# Patient Record
Sex: Female | Born: 1960 | Race: White | Hispanic: No | Marital: Married | Smoking: Never smoker
Health system: Southern US, Community
[De-identification: ages and names within clinical notes are randomized; demographics above are authoritative.]

## PROBLEM LIST (undated history)

## (undated) DIAGNOSIS — R748 Abnormal levels of other serum enzymes: Secondary | ICD-10-CM

## (undated) DIAGNOSIS — K631 Perforation of intestine (nontraumatic): Secondary | ICD-10-CM

## (undated) DIAGNOSIS — Z9049 Acquired absence of other specified parts of digestive tract: Secondary | ICD-10-CM

## (undated) DIAGNOSIS — Z90710 Acquired absence of both cervix and uterus: Secondary | ICD-10-CM

## (undated) DIAGNOSIS — K2970 Gastritis, unspecified, without bleeding: Secondary | ICD-10-CM

## (undated) DIAGNOSIS — E785 Hyperlipidemia, unspecified: Secondary | ICD-10-CM

## (undated) DIAGNOSIS — J45909 Unspecified asthma, uncomplicated: Secondary | ICD-10-CM

## (undated) DIAGNOSIS — K222 Esophageal obstruction: Secondary | ICD-10-CM

## (undated) DIAGNOSIS — D649 Anemia, unspecified: Secondary | ICD-10-CM

## (undated) DIAGNOSIS — R948 Abnormal results of function studies of other organs and systems: Secondary | ICD-10-CM

## (undated) DIAGNOSIS — N3010 Interstitial cystitis (chronic) without hematuria: Secondary | ICD-10-CM

## (undated) DIAGNOSIS — K219 Gastro-esophageal reflux disease without esophagitis: Secondary | ICD-10-CM

## (undated) DIAGNOSIS — E559 Vitamin D deficiency, unspecified: Secondary | ICD-10-CM

## (undated) DIAGNOSIS — Z1509 Genetic susceptibility to other malignant neoplasm: Secondary | ICD-10-CM

## (undated) DIAGNOSIS — L570 Actinic keratosis: Secondary | ICD-10-CM

## (undated) DIAGNOSIS — R1012 Left upper quadrant pain: Secondary | ICD-10-CM

## (undated) DIAGNOSIS — E663 Overweight: Secondary | ICD-10-CM

## (undated) HISTORY — DX: Esophageal obstruction: K22.2

## (undated) HISTORY — DX: Interstitial cystitis (chronic) without hematuria: N30.10

## (undated) HISTORY — DX: Abnormal levels of other serum enzymes: R74.8

## (undated) HISTORY — PX: FOOT SURGERY: SHX648

## (undated) HISTORY — PX: ABDOMINAL HYSTERECTOMY: SHX81

## (undated) HISTORY — PX: TOTAL COLECTOMY: SHX852

## (undated) HISTORY — DX: Actinic keratosis: L57.0

## (undated) HISTORY — DX: Gastritis, unspecified, without bleeding: K29.70

## (undated) HISTORY — DX: Overweight: E66.3

## (undated) HISTORY — DX: Perforation of intestine (nontraumatic): K63.1

## (undated) HISTORY — PX: CHOLECYSTECTOMY: SHX55

## (undated) HISTORY — DX: Abnormal results of function studies of other organs and systems: R94.8

## (undated) HISTORY — DX: Acquired absence of both cervix and uterus: Z90.710

## (undated) HISTORY — DX: Genetic susceptibility to other malignant neoplasm: Z15.09

## (undated) HISTORY — PX: APPENDECTOMY: SHX54

## (undated) HISTORY — DX: Vitamin D deficiency, unspecified: E55.9

## (undated) HISTORY — DX: Left upper quadrant pain: R10.12

## (undated) HISTORY — DX: Hyperlipidemia, unspecified: E78.5

## (undated) HISTORY — PX: CERVICAL FUSION: SHX112

## (undated) HISTORY — DX: Acquired absence of other specified parts of digestive tract: Z90.49

---

## 2018-08-30 DIAGNOSIS — Z0000 Encounter for general adult medical examination without abnormal findings: Secondary | ICD-10-CM | POA: Diagnosis not present

## 2018-09-02 ENCOUNTER — Other Ambulatory Visit: Payer: Self-pay | Admitting: Family Medicine

## 2018-09-02 DIAGNOSIS — Z0000 Encounter for general adult medical examination without abnormal findings: Secondary | ICD-10-CM | POA: Diagnosis not present

## 2018-09-02 DIAGNOSIS — Z13220 Encounter for screening for lipoid disorders: Secondary | ICD-10-CM | POA: Diagnosis not present

## 2018-09-02 DIAGNOSIS — M899 Disorder of bone, unspecified: Secondary | ICD-10-CM | POA: Diagnosis not present

## 2018-09-02 DIAGNOSIS — Z1159 Encounter for screening for other viral diseases: Secondary | ICD-10-CM | POA: Diagnosis not present

## 2018-09-02 DIAGNOSIS — Z1231 Encounter for screening mammogram for malignant neoplasm of breast: Secondary | ICD-10-CM

## 2018-09-27 ENCOUNTER — Ambulatory Visit
Admission: RE | Admit: 2018-09-27 | Discharge: 2018-09-27 | Disposition: A | Discharge status: Home / Self Care | Payer: BLUE CROSS/BLUE SHIELD | Source: Ambulatory Visit | Attending: Family Medicine | Admitting: Family Medicine

## 2018-09-27 DIAGNOSIS — Z1231 Encounter for screening mammogram for malignant neoplasm of breast: Secondary | ICD-10-CM | POA: Diagnosis not present

## 2019-05-12 DIAGNOSIS — Z23 Encounter for immunization: Secondary | ICD-10-CM | POA: Diagnosis not present

## 2019-12-08 ENCOUNTER — Other Ambulatory Visit (HOSPITAL_COMMUNITY): Payer: Self-pay | Admitting: Orthopedic Surgery

## 2019-12-19 ENCOUNTER — Other Ambulatory Visit: Payer: Self-pay

## 2019-12-19 ENCOUNTER — Encounter (HOSPITAL_BASED_OUTPATIENT_CLINIC_OR_DEPARTMENT_OTHER): Payer: Self-pay | Admitting: Orthopedic Surgery

## 2019-12-22 ENCOUNTER — Other Ambulatory Visit (HOSPITAL_COMMUNITY)
Admission: RE | Admit: 2019-12-22 | Discharge: 2019-12-22 | Disposition: A | Discharge status: Home / Self Care | Payer: BC Managed Care – PPO | Source: Ambulatory Visit | Attending: Orthopedic Surgery | Admitting: Orthopedic Surgery

## 2019-12-22 DIAGNOSIS — Z01812 Encounter for preprocedural laboratory examination: Secondary | ICD-10-CM | POA: Insufficient documentation

## 2019-12-22 DIAGNOSIS — Z20822 Contact with and (suspected) exposure to covid-19: Secondary | ICD-10-CM | POA: Diagnosis not present

## 2019-12-22 LAB — SARS CORONAVIRUS 2 (TAT 6-24 HRS): SARS Coronavirus 2: NEGATIVE

## 2019-12-22 NOTE — Progress Notes (Signed)

## 2019-12-25 ENCOUNTER — Encounter (HOSPITAL_BASED_OUTPATIENT_CLINIC_OR_DEPARTMENT_OTHER)
Admission: RE | Disposition: A | Discharge status: Home / Self Care | Payer: Self-pay | Source: Home / Self Care | Attending: Orthopedic Surgery

## 2019-12-25 ENCOUNTER — Ambulatory Visit (HOSPITAL_BASED_OUTPATIENT_CLINIC_OR_DEPARTMENT_OTHER): Payer: BC Managed Care – PPO | Admitting: Anesthesiology

## 2019-12-25 ENCOUNTER — Ambulatory Visit (HOSPITAL_BASED_OUTPATIENT_CLINIC_OR_DEPARTMENT_OTHER)
Admission: RE | Admit: 2019-12-25 | Discharge: 2019-12-25 | Disposition: A | Discharge status: Home / Self Care | Payer: BC Managed Care – PPO | Attending: Orthopedic Surgery | Admitting: Orthopedic Surgery

## 2019-12-25 ENCOUNTER — Other Ambulatory Visit: Payer: Self-pay

## 2019-12-25 ENCOUNTER — Encounter (HOSPITAL_BASED_OUTPATIENT_CLINIC_OR_DEPARTMENT_OTHER): Payer: Self-pay | Admitting: Orthopedic Surgery

## 2019-12-25 DIAGNOSIS — M2041 Other hammer toe(s) (acquired), right foot: Secondary | ICD-10-CM | POA: Insufficient documentation

## 2019-12-25 DIAGNOSIS — Z79899 Other long term (current) drug therapy: Secondary | ICD-10-CM | POA: Diagnosis not present

## 2019-12-25 DIAGNOSIS — M7741 Metatarsalgia, right foot: Secondary | ICD-10-CM | POA: Insufficient documentation

## 2019-12-25 DIAGNOSIS — J45909 Unspecified asthma, uncomplicated: Secondary | ICD-10-CM | POA: Insufficient documentation

## 2019-12-25 DIAGNOSIS — M2021 Hallux rigidus, right foot: Secondary | ICD-10-CM | POA: Diagnosis not present

## 2019-12-25 DIAGNOSIS — Z981 Arthrodesis status: Secondary | ICD-10-CM | POA: Diagnosis not present

## 2019-12-25 DIAGNOSIS — K219 Gastro-esophageal reflux disease without esophagitis: Secondary | ICD-10-CM | POA: Insufficient documentation

## 2019-12-25 HISTORY — PX: ARTHRODESIS METATARSALPHALANGEAL JOINT (MTPJ): SHX6566

## 2019-12-25 HISTORY — PX: WEIL OSTEOTOMY: SHX5044

## 2019-12-25 HISTORY — DX: Unspecified asthma, uncomplicated: J45.909

## 2019-12-25 HISTORY — DX: Gastro-esophageal reflux disease without esophagitis: K21.9

## 2019-12-25 HISTORY — DX: Anemia, unspecified: D64.9

## 2019-12-25 SURGERY — FUSION, JOINT, GREAT TOE
Anesthesia: Regional | Site: Toe | Laterality: Right

## 2019-12-25 MED ORDER — BUPIVACAINE HCL (PF) 0.5 % IJ SOLN
INTRAMUSCULAR | Status: AC
  Filled 2019-12-25: qty 30

## 2019-12-25 MED ORDER — FENTANYL CITRATE (PF) 100 MCG/2ML IJ SOLN: 25.0000 ug | INTRAMUSCULAR | Status: DC | PRN

## 2019-12-25 MED ORDER — PROPOFOL 500 MG/50ML IV EMUL
INTRAVENOUS | Status: DC | PRN
Start: 2019-12-25 — End: 2019-12-25
  Administered 2019-12-25: 150 ug/kg/min via INTRAVENOUS

## 2019-12-25 MED ORDER — VANCOMYCIN HCL 500 MG IV SOLR
INTRAVENOUS | Status: DC | PRN
  Administered 2019-12-25: 500 mg via TOPICAL

## 2019-12-25 MED ORDER — SENNA 8.6 MG PO TABS
2.0000 | ORAL_TABLET | Freq: Two times a day (BID) | ORAL | 0 refills | Status: DC
Start: 2019-12-25 — End: 2022-04-11

## 2019-12-25 MED ORDER — PHENYLEPHRINE HCL (PRESSORS) 10 MG/ML IV SOLN
INTRAVENOUS | Status: DC | PRN
  Administered 2019-12-25: 40 ug via INTRAVENOUS

## 2019-12-25 MED ORDER — OXYCODONE HCL 5 MG PO TABS: 5.0000 mg | ORAL_TABLET | Freq: Four times a day (QID) | ORAL | 0 refills | Status: AC | PRN

## 2019-12-25 MED ORDER — ROPIVACAINE HCL 5 MG/ML IJ SOLN
INTRAMUSCULAR | Status: DC | PRN
Start: 2019-12-25 — End: 2019-12-25
  Administered 2019-12-25: 20 mL via PERINEURAL
  Administered 2019-12-25: 15 mL via PERINEURAL

## 2019-12-25 MED ORDER — CEFAZOLIN SODIUM-DEXTROSE 2-4 GM/100ML-% IV SOLN: 2.0000 g | INTRAVENOUS | Status: DC

## 2019-12-25 MED ORDER — DOCUSATE SODIUM 100 MG PO CAPS
100.0000 mg | ORAL_CAPSULE | Freq: Every day | ORAL | 2 refills | Status: AC | PRN
Start: 2019-12-25 — End: 2020-12-24

## 2019-12-25 MED ORDER — FENTANYL CITRATE (PF) 100 MCG/2ML IJ SOLN
INTRAMUSCULAR | Status: AC
  Filled 2019-12-25: qty 2

## 2019-12-25 MED ORDER — 0.9 % SODIUM CHLORIDE (POUR BTL) OPTIME
TOPICAL | Status: DC | PRN
  Administered 2019-12-25: 1000 mL

## 2019-12-25 MED ORDER — DEXAMETHASONE SODIUM PHOSPHATE 10 MG/ML IJ SOLN
INTRAMUSCULAR | Status: DC | PRN
  Administered 2019-12-25: 4 mg via INTRAVENOUS

## 2019-12-25 MED ORDER — LIDOCAINE HCL (CARDIAC) PF 100 MG/5ML IV SOSY
PREFILLED_SYRINGE | INTRAVENOUS | Status: DC | PRN
  Administered 2019-12-25: 30 mg via INTRAVENOUS

## 2019-12-25 MED ORDER — MIDAZOLAM HCL 2 MG/2ML IJ SOLN
1.0000 mg | INTRAMUSCULAR | Status: DC | PRN
  Administered 2019-12-25: 2 mg via INTRAVENOUS

## 2019-12-25 MED ORDER — LACTATED RINGERS IV SOLN
INTRAVENOUS | Status: DC
  Administered 2019-12-25: 10 mL/h via INTRAVENOUS

## 2019-12-25 MED ORDER — PROPOFOL 10 MG/ML IV BOLUS
INTRAVENOUS | Status: DC | PRN
  Administered 2019-12-25: 50 mg via INTRAVENOUS

## 2019-12-25 MED ORDER — CEFAZOLIN SODIUM-DEXTROSE 2-4 GM/100ML-% IV SOLN
INTRAVENOUS | Status: AC
  Filled 2019-12-25: qty 100

## 2019-12-25 MED ORDER — MIDAZOLAM HCL 2 MG/2ML IJ SOLN
INTRAMUSCULAR | Status: AC
  Filled 2019-12-25: qty 2

## 2019-12-25 MED ORDER — DEXAMETHASONE SODIUM PHOSPHATE 10 MG/ML IJ SOLN
INTRAMUSCULAR | Status: DC | PRN
Start: 2019-12-25 — End: 2019-12-25
  Administered 2019-12-25 (×2): 5 mg

## 2019-12-25 MED ORDER — SODIUM CHLORIDE 0.9 % IV SOLN: INTRAVENOUS | Status: DC

## 2019-12-25 MED ORDER — VANCOMYCIN HCL 500 MG IV SOLR
INTRAVENOUS | Status: AC
  Filled 2019-12-25: qty 500

## 2019-12-25 MED ORDER — ACETAMINOPHEN 500 MG PO TABS: 1000.0000 mg | ORAL_TABLET | Freq: Once | ORAL | Status: DC

## 2019-12-25 MED ORDER — ONDANSETRON HCL 4 MG/2ML IJ SOLN
INTRAMUSCULAR | Status: DC | PRN
  Administered 2019-12-25: 4 mg via INTRAVENOUS

## 2019-12-25 MED ORDER — FENTANYL CITRATE (PF) 100 MCG/2ML IJ SOLN
50.0000 ug | INTRAMUSCULAR | Status: DC | PRN
  Administered 2019-12-25: 50 ug via INTRAVENOUS

## 2019-12-25 MED ORDER — LIDOCAINE-EPINEPHRINE 1 %-1:100000 IJ SOLN
INTRAMUSCULAR | Status: AC
  Filled 2019-12-25: qty 1

## 2019-12-25 SURGICAL SUPPLY — 93 items
BANDAGE ESMARK 6X9 LF (GAUZE/BANDAGES/DRESSINGS) IMPLANT
BIT DRILL 2.7XCANN QCK CNCT (BIT) IMPLANT
BIT DRILL CANN 2.7 (BIT) ×1
BIT DRILL Q-C 2.0 DIA 100 (BIT) ×1 IMPLANT
BIT DRL 2.7XCANN QCK CNCT (BIT) ×3
BLADE AVERAGE 25X9 (BLADE) IMPLANT
BLADE LONG MED 25X9 (BLADE) ×4 IMPLANT
BLADE MICRO SAGITTAL (BLADE) IMPLANT
BLADE OSC/SAG .038X5.5 CUT EDG (BLADE) ×1 IMPLANT
BLADE SURG 15 STRL LF DISP TIS (BLADE) ×9 IMPLANT
BLADE SURG 15 STRL SS (BLADE) ×3
BNDG COHESIVE 4X5 TAN STRL (GAUZE/BANDAGES/DRESSINGS) ×3 IMPLANT
BNDG COHESIVE 6X5 TAN STRL LF (GAUZE/BANDAGES/DRESSINGS) IMPLANT
BNDG CONFORM 2 STRL LF (GAUZE/BANDAGES/DRESSINGS) ×1 IMPLANT
BNDG CONFORM 3 STRL LF (GAUZE/BANDAGES/DRESSINGS) ×4 IMPLANT
BNDG ELASTIC 4X5.8 VLCR STR LF (GAUZE/BANDAGES/DRESSINGS) ×4 IMPLANT
BNDG ESMARK 4X9 LF (GAUZE/BANDAGES/DRESSINGS) IMPLANT
BNDG ESMARK 6X9 LF (GAUZE/BANDAGES/DRESSINGS)
BOOT STEPPER DURA LG (SOFTGOODS) IMPLANT
BOOT STEPPER DURA MED (SOFTGOODS) IMPLANT
BOOT STEPPER DURA SM (SOFTGOODS) ×1 IMPLANT
BOOT STEPPER DURA XLG (SOFTGOODS) IMPLANT
CAP PIN PROTECTOR ORTHO WHT (CAP) IMPLANT
CHLORAPREP W/TINT 26 (MISCELLANEOUS) ×4 IMPLANT
COVER BACK TABLE 60X90IN (DRAPES) ×4 IMPLANT
COVER WAND RF STERILE (DRAPES) IMPLANT
CUFF TOURN SGL QUICK 24 (TOURNIQUET CUFF)
CUFF TOURN SGL QUICK 34 (TOURNIQUET CUFF) ×1
CUFF TRNQT CYL 24X4X16.5-23 (TOURNIQUET CUFF) IMPLANT
CUFF TRNQT CYL 34X4.125X (TOURNIQUET CUFF) IMPLANT
DRAPE EXTREMITY T 121X128X90 (DISPOSABLE) ×4 IMPLANT
DRAPE OEC MINIVIEW 54X84 (DRAPES) ×4 IMPLANT
DRAPE U-SHAPE 47X51 STRL (DRAPES) ×4 IMPLANT
DRSG MEPITEL 4X7.2 (GAUZE/BANDAGES/DRESSINGS) ×4 IMPLANT
DRSG PAD ABDOMINAL 8X10 ST (GAUZE/BANDAGES/DRESSINGS) ×4 IMPLANT
ELECT REM PT RETURN 9FT ADLT (ELECTROSURGICAL) ×4
ELECTRODE REM PT RTRN 9FT ADLT (ELECTROSURGICAL) ×3 IMPLANT
GAUZE SPONGE 4X4 12PLY STRL (GAUZE/BANDAGES/DRESSINGS) ×4 IMPLANT
GLOVE BIO SURGEON STRL SZ8 (GLOVE) ×4 IMPLANT
GLOVE BIOGEL PI IND STRL 7.0 (GLOVE) IMPLANT
GLOVE BIOGEL PI IND STRL 8 (GLOVE) ×6 IMPLANT
GLOVE BIOGEL PI INDICATOR 7.0 (GLOVE) ×2
GLOVE BIOGEL PI INDICATOR 8 (GLOVE) ×2
GLOVE ECLIPSE 8.0 STRL XLNG CF (GLOVE) ×4 IMPLANT
GLOVE SURG SS PI 7.0 STRL IVOR (GLOVE) ×1 IMPLANT
GOWN STRL REUS W/ TWL LRG LVL3 (GOWN DISPOSABLE) ×3 IMPLANT
GOWN STRL REUS W/ TWL XL LVL3 (GOWN DISPOSABLE) ×6 IMPLANT
GOWN STRL REUS W/TWL LRG LVL3 (GOWN DISPOSABLE) ×1
GOWN STRL REUS W/TWL XL LVL3 (GOWN DISPOSABLE) ×2
K-WIRE .054X4 (WIRE) IMPLANT
K-WIRE ACE 1.6X6 (WIRE) ×4
KWIRE ACE 1.6X6 (WIRE) IMPLANT
NEEDLE HYPO 22GX1.5 SAFETY (NEEDLE) IMPLANT
NS IRRIG 1000ML POUR BTL (IV SOLUTION) ×4 IMPLANT
PAD CAST 4YDX4 CTTN HI CHSV (CAST SUPPLIES) ×3 IMPLANT
PADDING CAST ABS 4INX4YD NS (CAST SUPPLIES)
PADDING CAST ABS COTTON 4X4 ST (CAST SUPPLIES) IMPLANT
PADDING CAST COTTON 4X4 STRL (CAST SUPPLIES) ×1
PADDING CAST COTTON 6X4 STRL (CAST SUPPLIES) IMPLANT
PASSER SUT SWANSON 36MM LOOP (INSTRUMENTS) IMPLANT
PENCIL SMOKE EVACUATOR (MISCELLANEOUS) ×4 IMPLANT
PLATE TUB 39 W/COLLAR 5H (Plate) ×1 IMPLANT
SANITIZER HAND PURELL 535ML FO (MISCELLANEOUS) ×4 IMPLANT
SCREW CANN 1/3 THRD RVR CT (Screw) IMPLANT
SCREW CANNULATED 4.0X34MM (Screw) ×1 IMPLANT
SCREW CORT 2.5X20X2.7XST SM (Screw) IMPLANT
SCREW CORTICAL 2.7X14MM (Screw) ×1 IMPLANT
SCREW CORTICAL 2.7X18MM (Screw) ×2 IMPLANT
SCREW CORTICAL 2.7X20MM (Screw) ×1 IMPLANT
SCREW HCS TWIST-OFF 2.0X12MM (Screw) ×1 IMPLANT
SET BASIN DAY SURGERY F.S. (CUSTOM PROCEDURE TRAY) ×4 IMPLANT
SHEET MEDIUM DRAPE 40X70 STRL (DRAPES) ×4 IMPLANT
SLEEVE SCD COMPRESS KNEE MED (MISCELLANEOUS) ×4 IMPLANT
SPLINT FAST PLASTER 5X30 (CAST SUPPLIES)
SPLINT PLASTER CAST FAST 5X30 (CAST SUPPLIES) IMPLANT
SPONGE LAP 18X18 RF (DISPOSABLE) ×4 IMPLANT
SPONGE SURGIFOAM ABS GEL 12-7 (HEMOSTASIS) IMPLANT
STOCKINETTE 6  STRL (DRAPES) ×1
STOCKINETTE 6 STRL (DRAPES) ×3 IMPLANT
SUCTION FRAZIER HANDLE 10FR (MISCELLANEOUS) ×1
SUCTION TUBE FRAZIER 10FR DISP (MISCELLANEOUS) ×3 IMPLANT
SUT ETHILON 3 0 PS 1 (SUTURE) ×4 IMPLANT
SUT MNCRL AB 3-0 PS2 18 (SUTURE) ×4 IMPLANT
SUT VIC AB 0 SH 27 (SUTURE) IMPLANT
SUT VIC AB 2-0 SH 27 (SUTURE) ×1
SUT VIC AB 2-0 SH 27XBRD (SUTURE) ×3 IMPLANT
SUT VICRYL 0 UR6 27IN ABS (SUTURE) IMPLANT
SYR BULB EAR ULCER 3OZ GRN STR (SYRINGE) ×4 IMPLANT
SYR CONTROL 10ML LL (SYRINGE) IMPLANT
TOWEL GREEN STERILE FF (TOWEL DISPOSABLE) ×8 IMPLANT
TUBE CONNECTING 20X1/4 (TUBING) ×4 IMPLANT
UNDERPAD 30X36 HEAVY ABSORB (UNDERPADS AND DIAPERS) ×4 IMPLANT
YANKAUER SUCT BULB TIP NO VENT (SUCTIONS) IMPLANT

## 2019-12-25 NOTE — Discharge Instructions (Addendum)
John Hewitt, MD [facility]  Please read the following information regarding your care after surgery.  Medications  You only need a prescription for the narcotic pain medicine (ex. oxycodone, Percocet, Norco).  All of the other medicines listed below are available over the counter. X Aleve 2 pills twice a day for the first 3 days after surgery. X acetominophen (Tylenol) 650 mg every 4-6 hours as you need for minor to moderate pain X oxycodone as prescribed for severe pain  Narcotic pain medicine (ex. oxycodone, Percocet, Vicodin) will cause constipation.  To prevent this problem, take the following medicines while you are taking any pain medicine. X docusate sodium (Colace) 100 mg twice a day X senna (Senokot) 2 tablets twice a day  Weight Bearing X Bear weight only on your operated foot in the CAM boot.   Cast / Splint / Dressing X Keep your splint, cast or dressing clean and dry.  Don't put anything (coat hanger, pencil, etc) down inside of it.  If it gets damp, use a hair dryer on the cool setting to dry it.  If it gets soaked, call the office to schedule an appointment for a cast change.   After your dressing, cast or splint is removed; you may shower, but do not soak or scrub the wound.  Allow the water to run over it, and then gently pat it dry.  Swelling It is normal for you to have swelling where you had surgery.  To reduce swelling and pain, keep your toes above your nose for at least 3 days after surgery.  It may be necessary to keep your foot or leg elevated for several weeks.  If it hurts, it should be elevated.  Follow Up Call my office at [phone] when you are discharged from the hospital or surgery center to schedule an appointment to be seen two weeks after surgery.  Call my office at [phone] if you develop a fever >101.5 F, nausea, vomiting, bleeding from the surgical site or severe pain.     Regional Anesthesia Blocks  1. Numbness or the inability to  move the "blocked" extremity may last from 3-48 hours after placement. The length of time depends on the medication injected and your individual response to the medication. If the numbness is not going away after 48 hours, call your surgeon.  2. The extremity that is blocked will need to be protected until the numbness is gone and the  Strength has returned. Because you cannot feel it, you will need to take extra care to avoid injury. Because it may be weak, you may have difficulty moving it or using it. You may not know what position it is in without looking at it while the block is in effect.  3. For blocks in the legs and feet, returning to weight bearing and walking needs to be done carefully. You will need to wait until the numbness is entirely gone and the strength has returned. You should be able to move your leg and foot normally before you try and bear weight or walk. You will need someone to be with you when you first try to ensure you do not fall and possibly risk injury.  4. Bruising and tenderness at the needle site are common side effects and will resolve in a few days.  5. Persistent numbness or new problems with movement should be communicated to the surgeon or the [facility] ([phone])/ [facility] ([phone]).  

## 2019-12-25 NOTE — Progress Notes (Signed)
Assisted Dr. Woodrum with right, ultrasound guided, popliteal, adductor canal block. Side rails up, monitors on throughout procedure. See vital signs in flow sheet. Tolerated Procedure well. °

## 2019-12-25 NOTE — Anesthesia Postprocedure Evaluation (Signed)
Anesthesia Post Note  Patient: Jody Burns  Procedure(s) Performed: Right hallux metatarsal phalangeal joint arthrodesis (Right Toe) 2nd metatarsal Weil Osteotomy (Right Foot)     Patient location during evaluation: PACU Anesthesia Type: Regional and General Level of consciousness: awake and alert Pain management: pain level controlled Vital Signs Assessment: post-procedure vital signs reviewed and stable Respiratory status: spontaneous breathing, nonlabored ventilation, respiratory function stable and patient connected to nasal cannula oxygen Cardiovascular status: blood pressure returned to baseline and stable Postop Assessment: no apparent nausea or vomiting Anesthetic complications: no    Last Vitals:  Vitals:   12/25/19 1045 12/25/19 1114  BP: 96/72 140/76  Pulse: 81 77  Resp: (!) 21 16  Temp:  36.5 C  SpO2: 94% 98%    Last Pain:  Vitals:   12/25/19 1114  TempSrc: Oral  PainSc: 0-No pain                 Shivansh Hardaway L Kasyn Stouffer

## 2019-12-25 NOTE — Anesthesia Procedure Notes (Signed)
Anesthesia Regional Block: Adductor canal block   Pre-Anesthetic Checklist: ,, timeout performed, Correct Patient, Correct Site, Correct Laterality, Correct Procedure, Correct Position, site marked, Risks and benefits discussed,  Surgical consent,  Pre-op evaluation,  At surgeon's request and post-op pain management  Laterality: Right  Prep: Maximum Sterile Barrier Precautions used, chloraprep       Needles:  Injection technique: Single-shot  Needle Type: Echogenic Stimulator Needle     Needle Length: 9cm  Needle Gauge: 22     Additional Needles:   Procedures:,,,, ultrasound used (permanent image in chart),,,,  Narrative:  Start time: 12/25/2019 8:11 AM End time: 12/25/2019 8:20 AM Injection made incrementally with aspirations every 5 mL.  Performed by: Personally  Anesthesiologist: Elmer Picker, MD  Additional Notes: Monitors applied. No increased pain on injection. No increased resistance to injection. Injection made in 5cc increments. Good needle visualization. Patient tolerated procedure well.

## 2019-12-25 NOTE — Transfer of Care (Signed)
Immediate Anesthesia Transfer of Care Note  Patient: Jody Burns  Procedure(s) Performed: Right hallux metatarsal phalangeal joint arthrodesis (Right Toe) 2nd metatarsal Weil Osteotomy (Right Foot)  Patient Location: PACU  Anesthesia Type:GA combined with regional for post-op pain  Level of Consciousness: drowsy and patient cooperative  Airway & Oxygen Therapy: Patient Spontanous Breathing and Patient connected to face mask oxygen  Post-op Assessment: Report given to RN and Post -op Vital signs reviewed and stable  Post vital signs: Reviewed and stable  Last Vitals:  Vitals Value Taken Time  BP    Temp    Pulse    Resp    SpO2      Last Pain:  Vitals:   12/25/19 0739  TempSrc: Oral  PainSc: 6       Patients Stated Pain Goal: 3 (12/25/19 0739)  Complications: No apparent anesthesia complications

## 2019-12-25 NOTE — Op Note (Signed)
12/25/2019  10:26 AM  PATIENT:  Jody Burns  59 y.o. female  PRE-OPERATIVE DIAGNOSIS: 1.  Right hallux rigidus 2.  Right forefoot metatarsalgia 3.  Right second hammertoe deformity  POST-OPERATIVE DIAGNOSIS: Same  Procedure(s): 1.  Right hallux MP joint arthrodesis 2.  Right second metatarsal Weil osteotomy  SURGEON:  Wylene Simmer, MD  ASSISTANT: Mechele Claude, PA-C  ANESTHESIA:   General, regional  EBL:  minimal   TOURNIQUET:   Total Tourniquet Time Documented: Thigh (Right) - 42 minutes Total: Thigh (Right) - 42 minutes  COMPLICATIONS:  None apparent  DISPOSITION:  Extubated, awake and stable to recovery.  INDICATION FOR PROCEDURE: The patient is a 59 year old female with a long history of right forefoot pain.  She has hallux rigidus and a moderate bunion deformity as well as metatarsalgia and a second hammertoe.  She has failed nonoperative treatment to date and presents today for surgical correction of these painful forefoot deformities.  The risks and benefits of the alternative treatment options have been discussed in detail.  The patient wishes to proceed with surgery and specifically understands risks of bleeding, infection, nerve damage, blood clots, need for additional surgery, amputation and death.  PROCEDURE IN DETAIL:  After pre operative consent was obtained, and the correct operative site was identified, the patient was brought to the operating room and placed supine on the OR table.  Anesthesia was administered.  Pre-operative antibiotics were administered.  A surgical timeout was taken.  The right lower extremity was prepped and draped in standard sterile fashion with a tourniquet around the thigh.  The extremity was elevated and the tourniquet was inflated to 250 mmHg.  A longitudinal incision was made over the hallux MP joint.  Dissection was carried down through the subcutaneous tissues.  The extensor houses longus and brevis tendons were protected.  The  dorsal joint capsule was incised and elevated medially and laterally.  The collateral ligaments were released exposing the metatarsal head.  Peripheral osteophytes were removed with a rondure.  A K wire was inserted in the center of the head.  A concave reamer was used to remove the remaining articular cartilage and subchondral bone.  A convex reamer was then used to remove the remaining articular cartilage and subchondral bone from the base of the proximal phalanx.  The wound was irrigated cleaned of all bone debris.  A small drill bit was used to perforate both sides of the joint leaving the resultant bone graft in place.  The joint was reduced and provisionally pinned.  Radiographs and a simulated weightbearing examination showed appropriate alignment of the toe.  The guidepin was then used to insert a stainless steel 4 mm cannulated screw from the [employer].  The screw was noted to have adequate purchase.  A 5 hole one quarter tubular plate from the stainless steel mini frag set was selected.  It was contoured to fit the dorsum of the joint.  It was secured proximally with 2 bicortical screws and distally with 2 bicortical screws.  AP and lateral radiographs confirmed appropriate alignment of the hardware and appropriate reduction of the joint.  The wound was irrigated sprinkled with vancomycin powder.  The joint capsule was repaired with Vicryl.  Subcutaneous tissues were approximated with Monocryl.  The skin incision was closed with nylon.  Attention was turned to the second MTP joint where a longitudinal incision was made.  Dissection was carried down through the subcutaneous tissues.  The extensor tendons were protected and the  dorsal joint capsule was incised and elevated medially and laterally exposing the metatarsal head.  A Weil osteotomy was then made with the oscillating saw all removing a small wedge of bone distally.  The head of the metatarsal was allowed to retract several millimeters  proximally and was fixed with a 2 mm [employer] FRS screw.  Overhanging bone was trimmed with a rondure.  Shortening of the metatarsal was noted to correct the hammertoe deformity.  The hammertoe was noted to be flexible and was passively aligned the same as the adjacent toe.  The decision was made to forego formal hammertoe correction.  The wound was irrigated copiously.  Vancomycin powder was sprinkled in the wound.  Subcutaneous tissues were approximated with Monocryl and the skin was closed with nylon.  Sterile dressings were applied followed by a compression dressing and a cam boot.  The tourniquet was released after application of the dressings.  The patient was awakened from anesthesia and transported to the recovery room in stable condition.   FOLLOW UP PLAN: Weightbearing as tolerated in the cam boot.  Follow-up in 2 weeks for suture removal.   RADIOGRAPHS: AP and lateral radiographs of the right foot are obtained intraoperatively.  These show interval arthrodesis of the hallux MP joint and correction of the bunion deformity.  Hardware is appropriately positioned and of the appropriate lengths.  Second metatarsal has been shortened and the screw was appropriately positioned.  Hammertoe is corrected.  No other acute injuries are noted.    Alfredo Martinez PA-C was present and scrubbed for the duration of the operative case. His assistance was essential in positioning the patient, prepping and draping, gaining and maintaining exposure, performing the operation, closing and dressing the wounds and applying the splint.

## 2019-12-25 NOTE — Anesthesia Procedure Notes (Signed)
Procedure Name: LMA Insertion Date/Time: 12/25/2019 8:53 AM Performed by: Sheryn Bison, CRNA Pre-anesthesia Checklist: Patient identified, Emergency Drugs available, Suction available and Patient being monitored Patient Re-evaluated:Patient Re-evaluated prior to induction Oxygen Delivery Method: Circle system utilized Preoxygenation: Pre-oxygenation with 100% oxygen Induction Type: IV induction Ventilation: Mask ventilation without difficulty LMA: LMA inserted LMA Size: 4.0 Number of attempts: 1 Airway Equipment and Method: Bite block Placement Confirmation: positive ETCO2 Tube secured with: Tape Dental Injury: Teeth and Oropharynx as per pre-operative assessment

## 2019-12-25 NOTE — Anesthesia Preprocedure Evaluation (Addendum)
Anesthesia Evaluation  Patient identified by MRN, date of birth, ID band Patient awake    Reviewed: Allergy & Precautions, NPO status , Patient's Chart, lab work & pertinent test results  Airway Mallampati: II  TM Distance: >3 FB Neck ROM: Full    Dental no notable dental hx. (+) Teeth Intact, Dental Advisory Given   Pulmonary asthma ,    Pulmonary exam normal breath sounds clear to auscultation       Cardiovascular negative cardio ROS Normal cardiovascular exam Rhythm:Regular Rate:Normal     Neuro/Psych negative neurological ROS  negative psych ROS   GI/Hepatic Neg liver ROS, GERD  Controlled,  Endo/Other  negative endocrine ROS  Renal/GU negative Renal ROS  negative genitourinary   Musculoskeletal negative musculoskeletal ROS (+)   Abdominal   Peds  Hematology negative hematology ROS (+)   Anesthesia Other Findings   Reproductive/Obstetrics                           Anesthesia Physical Anesthesia Plan  ASA: II  Anesthesia Plan: General and Regional   Post-op Pain Management:  Regional for Post-op pain   Induction: Intravenous  PONV Risk Score and Plan: 3 and Ondansetron, Dexamethasone and Midazolam  Airway Management Planned: LMA  Additional Equipment:   Intra-op Plan:   Post-operative Plan: Extubation in OR  Informed Consent: I have reviewed the patients History and Physical, chart, labs and discussed the procedure including the risks, benefits and alternatives for the proposed anesthesia with the patient or authorized representative who has indicated his/her understanding and acceptance.     Dental advisory given  Plan Discussed with: CRNA  Anesthesia Plan Comments:         Anesthesia Quick Evaluation

## 2019-12-25 NOTE — Anesthesia Procedure Notes (Signed)
Anesthesia Regional Block: Popliteal block   Pre-Anesthetic Checklist: ,, timeout performed, Correct Patient, Correct Site, Correct Laterality, Correct Procedure, Correct Position, site marked, Risks and benefits discussed,  Surgical consent,  Pre-op evaluation,  At surgeon's request and post-op pain management  Laterality: Right  Prep: Maximum Sterile Barrier Precautions used, chloraprep       Needles:  Injection technique: Single-shot  Needle Type: Echogenic Stimulator Needle     Needle Length: 9cm  Needle Gauge: 22     Additional Needles:   Procedures:,,,, ultrasound used (permanent image in chart),,,,  Narrative:  Start time: 12/25/2019 8:00 AM End time: 12/25/2019 8:10 AM Injection made incrementally with aspirations every 5 mL.  Performed by: Personally  Anesthesiologist: Elmer Picker, MD  Additional Notes: Monitors applied. No increased pain on injection. No increased resistance to injection. Injection made in 5cc increments. Good needle visualization. Patient tolerated procedure well.

## 2019-12-25 NOTE — H&P (Signed)
Jody Burns is an 59 y.o. female.   Chief Complaint: Right foot pain HPI: The patient is a 59 year old female without significant past medical history.  She has a long history of right forefoot pain due to hallux rigidus and metatarsalgia with a second hammertoe.  She has failed nonoperative treatment to date including activity modification, oral anti-inflammatories and shoewear modification.  She presents now for surgical treatment of this painful and limiting forefoot condition.  Past Medical History:  Diagnosis Date  . Anemia   . Asthma   . GERD (gastroesophageal reflux disease)     Past Surgical History:  Procedure Laterality Date  . ABDOMINAL HYSTERECTOMY    . APPENDECTOMY    . CERVICAL FUSION    . CESAREAN SECTION    . CHOLECYSTECTOMY    . FOOT SURGERY Left   . TOTAL COLECTOMY      History reviewed. No pertinent family history. Social History:  reports that she has never smoked. She has never used smokeless tobacco. She reports previous alcohol use. She reports that she does not use drugs.  Allergies:  Allergies  Allergen Reactions  . Bactrim [Sulfamethoxazole-Trimethoprim]   . Ceftin [Cefuroxime] Hives  . Dilaudid [Hydromorphone] Nausea And Vomiting  . Lorcet [Hydrocodone-Acetaminophen] Hives  . Soy Allergy   . Sulfa Antibiotics     Medications Prior to Admission  Medication Sig Dispense Refill  . calcium carbonate (OS-CAL - DOSED IN MG OF ELEMENTAL CALCIUM) 1250 (500 Ca) MG tablet Take 1 tablet by mouth.    . calcium-vitamin D (OSCAL WITH D) 500-200 MG-UNIT tablet Take 1 tablet by mouth.    . ferrous sulfate 324 MG TBEC Take 324 mg by mouth.    Marland Kitchen omeprazole (PRILOSEC) 10 MG capsule Take 10 mg by mouth daily.    . ondansetron (ZOFRAN) 4 MG tablet Take 4 mg by mouth every 8 (eight) hours as needed for nausea or vomiting.    . Probiotic Product (PROBIOTIC ADVANCED PO) Take by mouth.    . vitamin B-12 (CYANOCOBALAMIN) 500 MCG tablet Take 500 mcg by mouth daily.     Marland Kitchen albuterol (VENTOLIN HFA) 108 (90 Base) MCG/ACT inhaler Inhale 2 puffs into the lungs every 6 (six) hours as needed for wheezing or shortness of breath.      No results found for this or any previous visit (from the past 48 hour(s)). No results found.  Review of Systems no recent fever, chills, nausea, vomiting or changes in her appetite  Blood pressure 122/71, pulse 98, temperature (!) 97.1 F (36.2 C), temperature source Oral, resp. rate 20, height 4\' 11"  (1.499 m), weight 62.6 kg, SpO2 98 %. Physical Exam  Well-nourished well-developed woman in no apparent distress.  Alert and oriented x4.  Normal mood and affect.  Gait is antalgic to the right.  Right hallux has decreased range of motion and a small bunion deformity.  Second hammertoe deformity is noted.  Intact and healthy skin dorsally and plantarly at the forefoot.  Normal sensibility to light touch as well.  No lymphadenopathy.  Pulses are palpable in the foot.   Assessment/Plan Right hallux rigidus, metatarsalgia and second hammertoe deformity -to the operating room today for hallux MP joint arthrodesis, second metatarsal Weil osteotomy and second hammertoe correction.  The risks and benefits of the alternative treatment options have been discussed in detail.  The patient wishes to proceed with surgery and specifically understands risks of bleeding, infection, nerve damage, blood clots, need for additional surgery, amputation and death.  Wylene Simmer, MD 12/25/2019, 8:27 AM

## 2019-12-26 ENCOUNTER — Encounter: Payer: Self-pay | Admitting: *Deleted

## 2020-01-09 ENCOUNTER — Other Ambulatory Visit (HOSPITAL_COMMUNITY): Payer: Self-pay | Admitting: Orthopedic Surgery

## 2020-01-09 ENCOUNTER — Ambulatory Visit (HOSPITAL_COMMUNITY)
Admission: RE | Admit: 2020-01-09 | Discharge: 2020-01-09 | Disposition: A | Discharge status: Home / Self Care | Payer: BC Managed Care – PPO | Source: Ambulatory Visit | Attending: Cardiovascular Disease | Admitting: Cardiovascular Disease

## 2020-01-09 ENCOUNTER — Other Ambulatory Visit: Payer: Self-pay

## 2020-01-09 DIAGNOSIS — M79604 Pain in right leg: Secondary | ICD-10-CM

## 2020-03-09 IMAGING — MG DIGITAL SCREENING BILATERAL MAMMOGRAM WITH TOMO AND CAD
8 series · 9 of 24 positions shown · non-contrast
Comparison: Previous exam(s).

CLINICAL DATA: Screening.

EXAM:
DIGITAL SCREENING BILATERAL MAMMOGRAM WITH TOMO AND CAD

[R CC synth-2D]
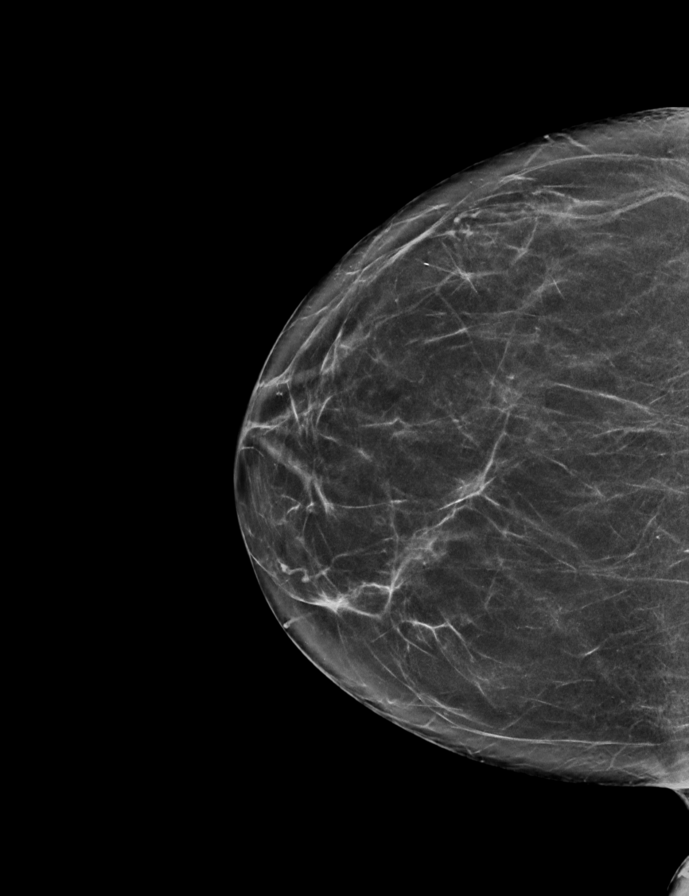

[L CC synth-2D]
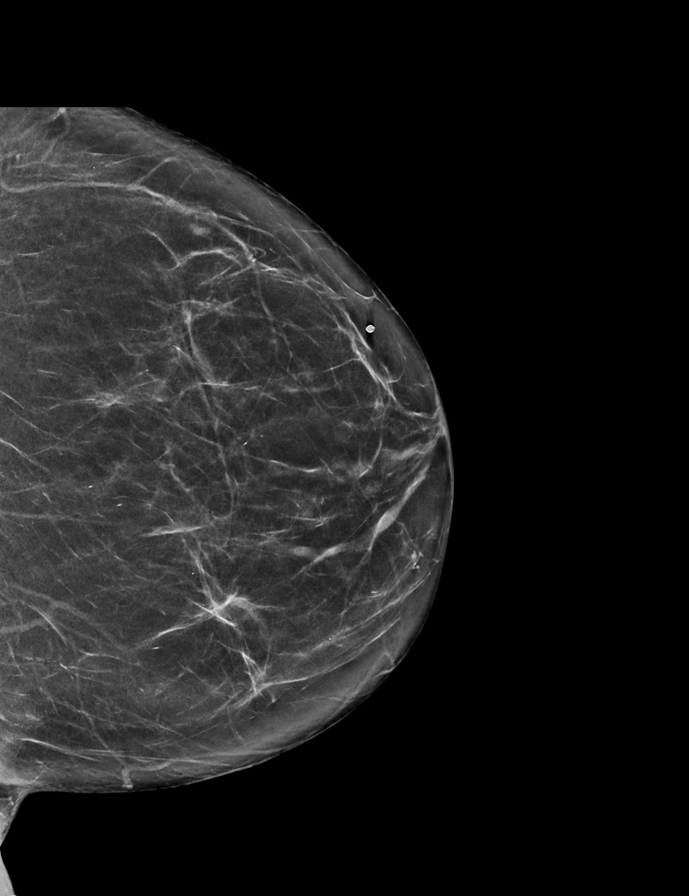

[R MLO synth-2D]
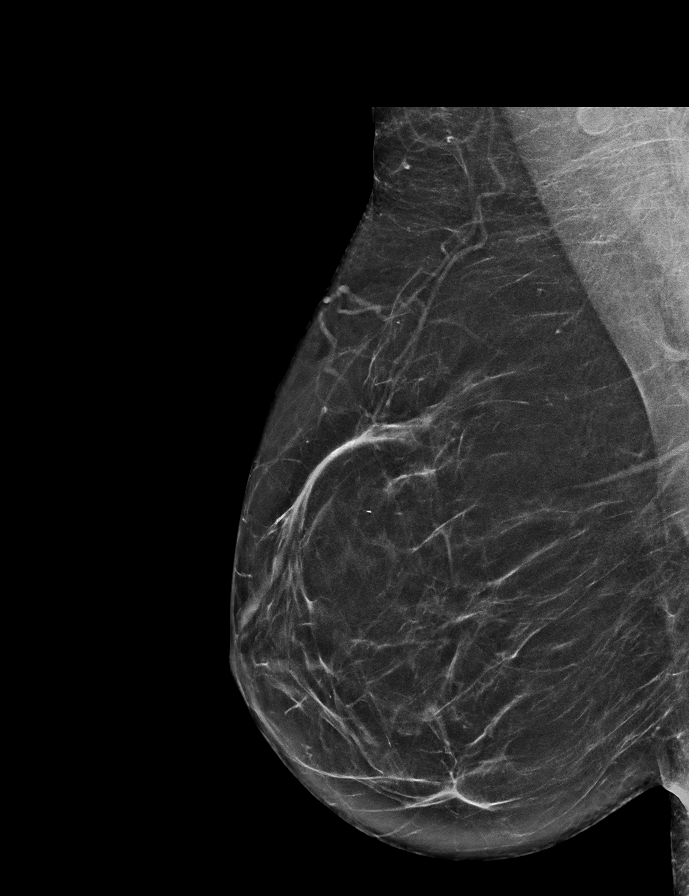

[L MLO synth-2D]
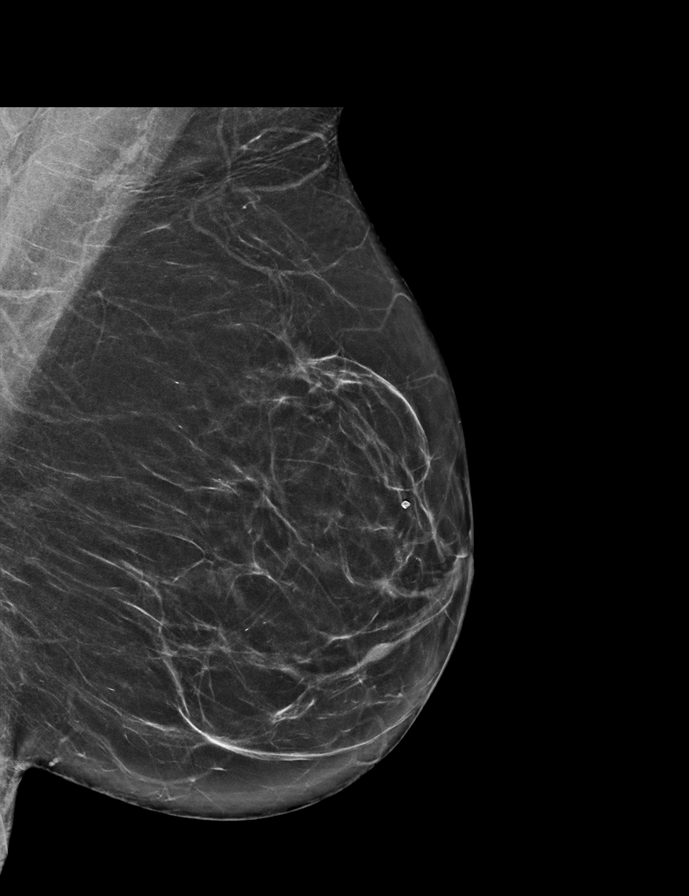

[L CC tomo · 2 of 63 frames shown]
[frame 21/63]
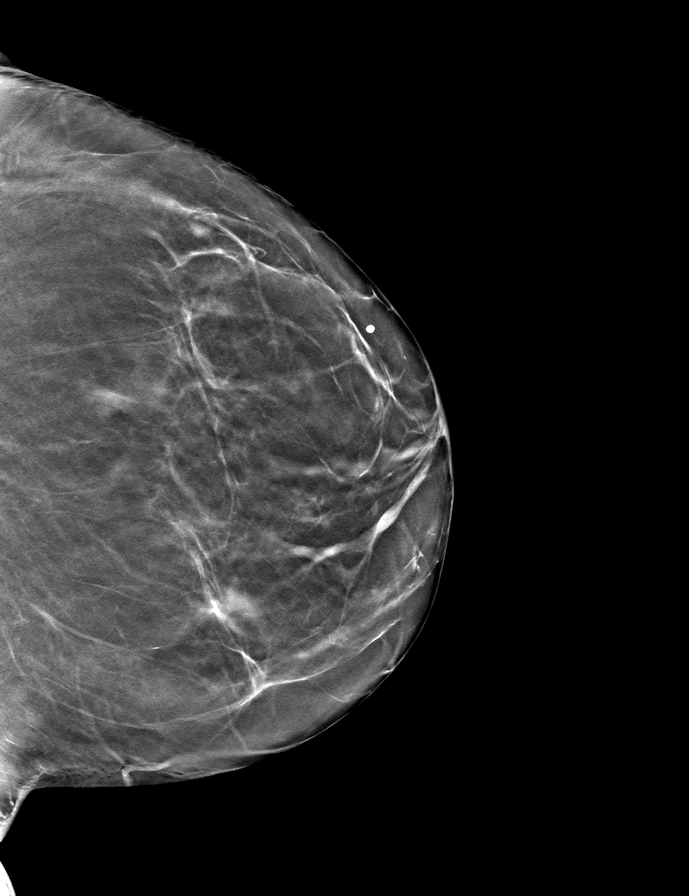
[frame 32/63]
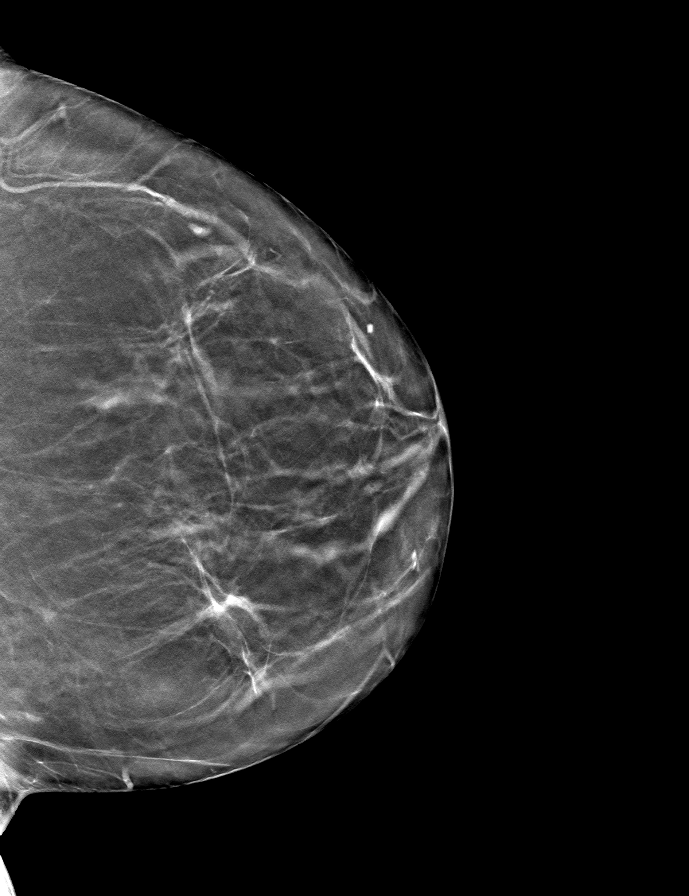

[R MLO tomo · tomo slice 33/66.0]
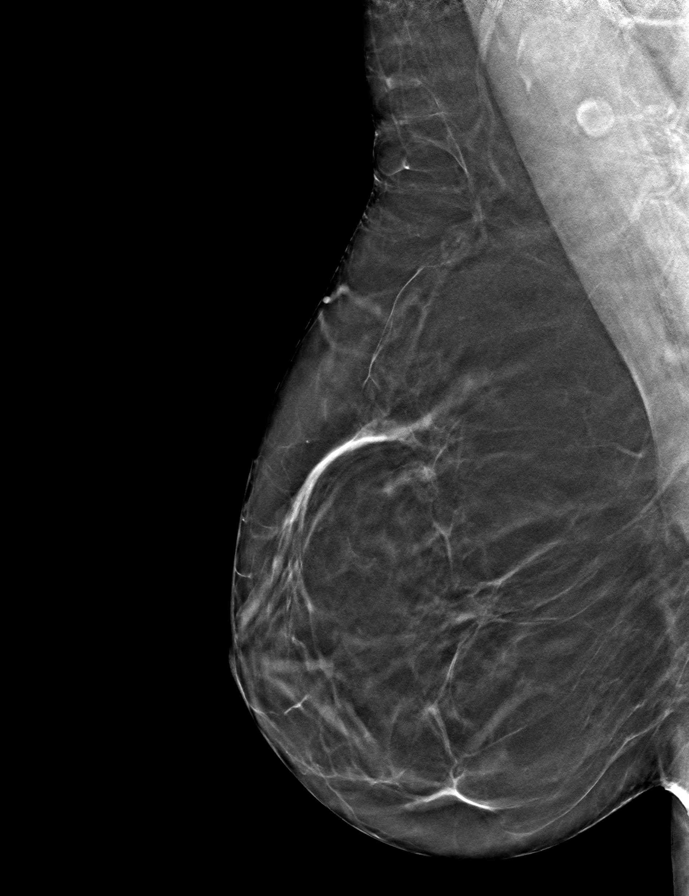

[R CC tomo · tomo slice 31/62.0]
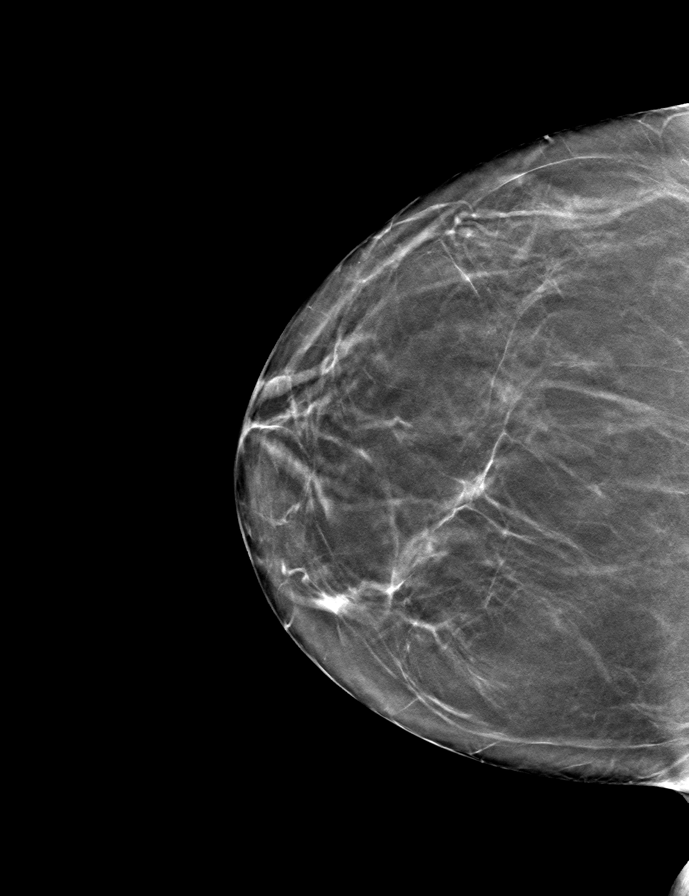

[L MLO tomo · tomo slice 34/67.0]
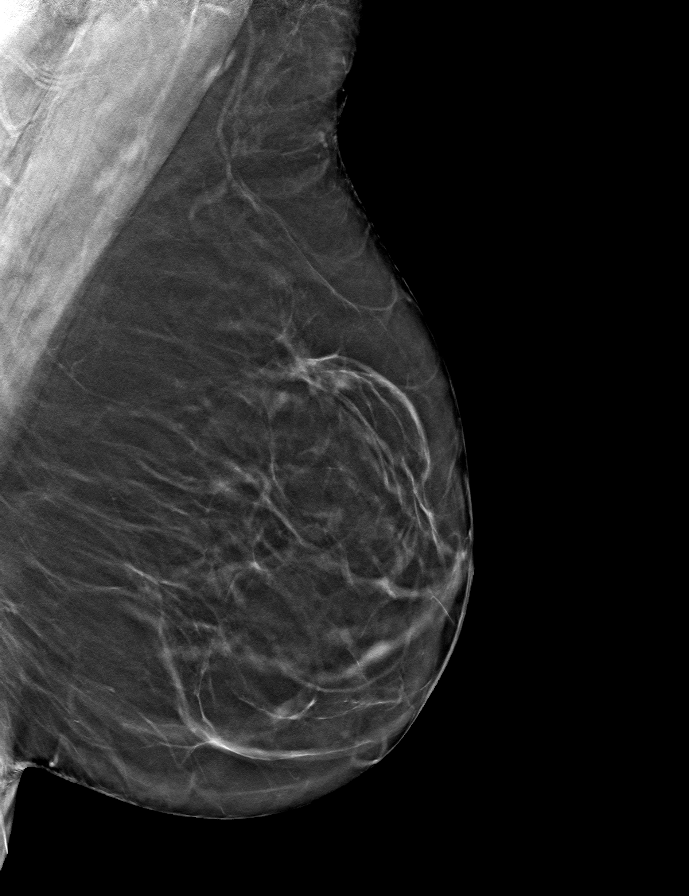

[9 of 24 positions shown; findings below may reference images not displayed]

ACR Breast Density Category b: There are scattered areas of
fibroglandular density.
FINDINGS: There are no findings suspicious for malignancy. Images were
processed with CAD.
IMPRESSION: No mammographic evidence of malignancy. A result letter of this
screening mammogram will be mailed directly to the patient.

RECOMMENDATION:
Screening mammogram in one year. (Code:[id number])

BI-RADS CATEGORY  1: Negative.

## 2020-04-30 ENCOUNTER — Other Ambulatory Visit: Payer: Self-pay | Admitting: Family Medicine

## 2020-04-30 DIAGNOSIS — Z1231 Encounter for screening mammogram for malignant neoplasm of breast: Secondary | ICD-10-CM

## 2020-05-05 ENCOUNTER — Ambulatory Visit
Admission: RE | Admit: 2020-05-05 | Discharge: 2020-05-05 | Disposition: A | Discharge status: Home / Self Care | Payer: BC Managed Care – PPO | Source: Ambulatory Visit | Attending: Family Medicine | Admitting: Family Medicine

## 2020-05-05 ENCOUNTER — Other Ambulatory Visit: Payer: Self-pay

## 2020-05-05 DIAGNOSIS — Z1231 Encounter for screening mammogram for malignant neoplasm of breast: Secondary | ICD-10-CM

## 2020-06-07 ENCOUNTER — Other Ambulatory Visit: Payer: Self-pay | Admitting: Physician Assistant

## 2020-06-07 DIAGNOSIS — M8580 Other specified disorders of bone density and structure, unspecified site: Secondary | ICD-10-CM

## 2020-06-28 ENCOUNTER — Other Ambulatory Visit: Payer: Self-pay | Admitting: Physician Assistant

## 2020-06-28 ENCOUNTER — Ambulatory Visit
Admission: RE | Admit: 2020-06-28 | Discharge: 2020-06-28 | Disposition: A | Discharge status: Home / Self Care | Payer: BC Managed Care – PPO | Source: Ambulatory Visit | Attending: Physician Assistant | Admitting: Physician Assistant

## 2020-06-28 DIAGNOSIS — R1031 Right lower quadrant pain: Secondary | ICD-10-CM

## 2020-06-28 MED ORDER — IOPAMIDOL (ISOVUE-300) INJECTION 61%
100.0000 mL | Freq: Once | INTRAVENOUS | Status: AC | PRN
  Administered 2020-06-28: 100 mL via INTRAVENOUS

## 2020-08-10 ENCOUNTER — Ambulatory Visit
Admission: RE | Admit: 2020-08-10 | Discharge: 2020-08-10 | Disposition: A | Discharge status: Home / Self Care | Payer: BC Managed Care – PPO | Source: Ambulatory Visit | Attending: Physician Assistant | Admitting: Physician Assistant

## 2020-08-10 ENCOUNTER — Other Ambulatory Visit: Payer: Self-pay | Admitting: Physician Assistant

## 2020-08-10 DIAGNOSIS — R1084 Generalized abdominal pain: Secondary | ICD-10-CM

## 2020-08-18 ENCOUNTER — Other Ambulatory Visit: Payer: Self-pay | Admitting: Physician Assistant

## 2020-08-18 DIAGNOSIS — R1012 Left upper quadrant pain: Secondary | ICD-10-CM

## 2020-08-20 ENCOUNTER — Ambulatory Visit
Admission: RE | Admit: 2020-08-20 | Discharge: 2020-08-20 | Disposition: A | Discharge status: Home / Self Care | Payer: BC Managed Care – PPO | Source: Ambulatory Visit | Attending: Physician Assistant | Admitting: Physician Assistant

## 2020-08-20 DIAGNOSIS — R1012 Left upper quadrant pain: Secondary | ICD-10-CM

## 2020-08-20 MED ORDER — IOPAMIDOL (ISOVUE-300) INJECTION 61%
100.0000 mL | Freq: Once | INTRAVENOUS | Status: AC | PRN
  Administered 2020-08-20: 100 mL via INTRAVENOUS

## 2020-08-23 ENCOUNTER — Other Ambulatory Visit: Payer: Self-pay | Admitting: Physician Assistant

## 2020-08-23 DIAGNOSIS — R1012 Left upper quadrant pain: Secondary | ICD-10-CM

## 2020-09-22 ENCOUNTER — Other Ambulatory Visit: Payer: BC Managed Care – PPO

## 2020-09-28 ENCOUNTER — Other Ambulatory Visit: Payer: BC Managed Care – PPO

## 2020-09-29 ENCOUNTER — Other Ambulatory Visit: Payer: Self-pay

## 2020-09-29 ENCOUNTER — Ambulatory Visit
Admission: RE | Admit: 2020-09-29 | Discharge: 2020-09-29 | Disposition: A | Discharge status: Home / Self Care | Payer: BC Managed Care – PPO | Source: Ambulatory Visit | Attending: Physician Assistant | Admitting: Physician Assistant

## 2020-09-29 DIAGNOSIS — M8580 Other specified disorders of bone density and structure, unspecified site: Secondary | ICD-10-CM

## 2020-11-16 DIAGNOSIS — E28319 Asymptomatic premature menopause: Secondary | ICD-10-CM | POA: Insufficient documentation

## 2020-11-16 DIAGNOSIS — M810 Age-related osteoporosis without current pathological fracture: Secondary | ICD-10-CM | POA: Insufficient documentation

## 2020-11-16 DIAGNOSIS — Z1509 Genetic susceptibility to other malignant neoplasm: Secondary | ICD-10-CM | POA: Insufficient documentation

## 2021-01-13 ENCOUNTER — Other Ambulatory Visit: Payer: Self-pay | Admitting: Student

## 2021-01-13 DIAGNOSIS — M2021 Hallux rigidus, right foot: Secondary | ICD-10-CM

## 2021-01-14 ENCOUNTER — Other Ambulatory Visit: Payer: Self-pay

## 2021-01-14 ENCOUNTER — Ambulatory Visit
Admission: RE | Admit: 2021-01-14 | Discharge: 2021-01-14 | Disposition: A | Discharge status: Home / Self Care | Payer: BC Managed Care – PPO | Source: Ambulatory Visit | Attending: Student | Admitting: Student

## 2021-01-14 DIAGNOSIS — M2021 Hallux rigidus, right foot: Secondary | ICD-10-CM

## 2021-01-21 ENCOUNTER — Other Ambulatory Visit: Payer: BC Managed Care – PPO

## 2021-02-16 ENCOUNTER — Other Ambulatory Visit: Payer: Self-pay | Admitting: Physician Assistant

## 2021-02-16 ENCOUNTER — Ambulatory Visit
Admission: RE | Admit: 2021-02-16 | Discharge: 2021-02-16 | Disposition: A | Discharge status: Home / Self Care | Payer: BC Managed Care – PPO | Source: Ambulatory Visit | Attending: Physician Assistant | Admitting: Physician Assistant

## 2021-02-16 ENCOUNTER — Other Ambulatory Visit: Payer: Self-pay

## 2021-02-16 DIAGNOSIS — M25561 Pain in right knee: Secondary | ICD-10-CM

## 2021-02-16 DIAGNOSIS — M25562 Pain in left knee: Secondary | ICD-10-CM

## 2021-03-22 ENCOUNTER — Other Ambulatory Visit: Payer: Self-pay | Admitting: Physician Assistant

## 2021-03-22 DIAGNOSIS — Z1231 Encounter for screening mammogram for malignant neoplasm of breast: Secondary | ICD-10-CM

## 2021-05-13 ENCOUNTER — Other Ambulatory Visit: Payer: Self-pay

## 2021-05-13 ENCOUNTER — Ambulatory Visit
Admission: RE | Admit: 2021-05-13 | Discharge: 2021-05-13 | Disposition: A | Discharge status: Home / Self Care | Payer: BC Managed Care – PPO | Source: Ambulatory Visit

## 2021-05-13 DIAGNOSIS — Z1231 Encounter for screening mammogram for malignant neoplasm of breast: Secondary | ICD-10-CM

## 2021-08-16 DIAGNOSIS — L244 Irritant contact dermatitis due to drugs in contact with skin: Secondary | ICD-10-CM | POA: Diagnosis not present

## 2021-08-16 DIAGNOSIS — L570 Actinic keratosis: Secondary | ICD-10-CM | POA: Diagnosis not present

## 2021-09-07 DIAGNOSIS — J029 Acute pharyngitis, unspecified: Secondary | ICD-10-CM | POA: Diagnosis not present

## 2021-09-21 ENCOUNTER — Other Ambulatory Visit: Payer: Self-pay | Admitting: Internal Medicine

## 2021-09-21 DIAGNOSIS — M818 Other osteoporosis without current pathological fracture: Secondary | ICD-10-CM

## 2021-09-29 DIAGNOSIS — J4520 Mild intermittent asthma, uncomplicated: Secondary | ICD-10-CM | POA: Diagnosis not present

## 2021-09-29 DIAGNOSIS — E559 Vitamin D deficiency, unspecified: Secondary | ICD-10-CM | POA: Diagnosis not present

## 2021-09-29 DIAGNOSIS — Z0000 Encounter for general adult medical examination without abnormal findings: Secondary | ICD-10-CM | POA: Diagnosis not present

## 2021-09-29 DIAGNOSIS — E782 Mixed hyperlipidemia: Secondary | ICD-10-CM | POA: Diagnosis not present

## 2021-09-29 DIAGNOSIS — E538 Deficiency of other specified B group vitamins: Secondary | ICD-10-CM | POA: Diagnosis not present

## 2021-09-29 DIAGNOSIS — E611 Iron deficiency: Secondary | ICD-10-CM | POA: Diagnosis not present

## 2021-10-03 ENCOUNTER — Other Ambulatory Visit: Payer: Self-pay

## 2021-10-03 ENCOUNTER — Ambulatory Visit
Admission: RE | Admit: 2021-10-03 | Discharge: 2021-10-03 | Disposition: A | Discharge status: Home / Self Care | Payer: BC Managed Care – PPO | Source: Ambulatory Visit | Attending: Internal Medicine | Admitting: Internal Medicine

## 2021-10-03 DIAGNOSIS — Z780 Asymptomatic menopausal state: Secondary | ICD-10-CM | POA: Diagnosis not present

## 2021-10-03 DIAGNOSIS — M818 Other osteoporosis without current pathological fracture: Secondary | ICD-10-CM

## 2021-10-03 DIAGNOSIS — M810 Age-related osteoporosis without current pathological fracture: Secondary | ICD-10-CM | POA: Diagnosis not present

## 2021-10-03 DIAGNOSIS — M8588 Other specified disorders of bone density and structure, other site: Secondary | ICD-10-CM | POA: Diagnosis not present

## 2021-10-11 DIAGNOSIS — E559 Vitamin D deficiency, unspecified: Secondary | ICD-10-CM | POA: Diagnosis not present

## 2021-10-11 DIAGNOSIS — K222 Esophageal obstruction: Secondary | ICD-10-CM | POA: Diagnosis not present

## 2021-10-11 DIAGNOSIS — R748 Abnormal levels of other serum enzymes: Secondary | ICD-10-CM | POA: Diagnosis not present

## 2021-10-11 DIAGNOSIS — R1310 Dysphagia, unspecified: Secondary | ICD-10-CM | POA: Diagnosis not present

## 2021-11-01 DIAGNOSIS — D225 Melanocytic nevi of trunk: Secondary | ICD-10-CM | POA: Diagnosis not present

## 2021-11-01 DIAGNOSIS — L244 Irritant contact dermatitis due to drugs in contact with skin: Secondary | ICD-10-CM | POA: Diagnosis not present

## 2021-11-01 DIAGNOSIS — L814 Other melanin hyperpigmentation: Secondary | ICD-10-CM | POA: Diagnosis not present

## 2021-11-01 DIAGNOSIS — L821 Other seborrheic keratosis: Secondary | ICD-10-CM | POA: Diagnosis not present

## 2021-11-07 DIAGNOSIS — M25521 Pain in right elbow: Secondary | ICD-10-CM | POA: Diagnosis not present

## 2021-11-10 DIAGNOSIS — M25521 Pain in right elbow: Secondary | ICD-10-CM | POA: Diagnosis not present

## 2021-11-23 DIAGNOSIS — M25511 Pain in right shoulder: Secondary | ICD-10-CM | POA: Diagnosis not present

## 2021-12-28 DIAGNOSIS — N644 Mastodynia: Secondary | ICD-10-CM | POA: Diagnosis not present

## 2021-12-28 DIAGNOSIS — E782 Mixed hyperlipidemia: Secondary | ICD-10-CM | POA: Diagnosis not present

## 2022-01-02 ENCOUNTER — Other Ambulatory Visit: Payer: Self-pay | Admitting: Family Medicine

## 2022-01-02 DIAGNOSIS — N644 Mastodynia: Secondary | ICD-10-CM

## 2022-01-02 DIAGNOSIS — Z800 Family history of malignant neoplasm of digestive organs: Secondary | ICD-10-CM

## 2022-01-04 DIAGNOSIS — R928 Other abnormal and inconclusive findings on diagnostic imaging of breast: Secondary | ICD-10-CM | POA: Diagnosis not present

## 2022-01-04 DIAGNOSIS — N6489 Other specified disorders of breast: Secondary | ICD-10-CM | POA: Diagnosis not present

## 2022-01-05 DIAGNOSIS — M25511 Pain in right shoulder: Secondary | ICD-10-CM | POA: Diagnosis not present

## 2022-01-05 DIAGNOSIS — S43431D Superior glenoid labrum lesion of right shoulder, subsequent encounter: Secondary | ICD-10-CM | POA: Diagnosis not present

## 2022-01-17 DIAGNOSIS — M25511 Pain in right shoulder: Secondary | ICD-10-CM | POA: Diagnosis not present

## 2022-01-17 DIAGNOSIS — S43431D Superior glenoid labrum lesion of right shoulder, subsequent encounter: Secondary | ICD-10-CM | POA: Diagnosis not present

## 2022-01-25 DIAGNOSIS — M810 Age-related osteoporosis without current pathological fracture: Secondary | ICD-10-CM | POA: Diagnosis not present

## 2022-01-25 DIAGNOSIS — Z1509 Genetic susceptibility to other malignant neoplasm: Secondary | ICD-10-CM | POA: Diagnosis not present

## 2022-01-25 DIAGNOSIS — M818 Other osteoporosis without current pathological fracture: Secondary | ICD-10-CM | POA: Diagnosis not present

## 2022-01-30 ENCOUNTER — Other Ambulatory Visit: Payer: Self-pay | Admitting: Internal Medicine

## 2022-01-30 DIAGNOSIS — M818 Other osteoporosis without current pathological fracture: Secondary | ICD-10-CM

## 2022-02-08 DIAGNOSIS — E041 Nontoxic single thyroid nodule: Secondary | ICD-10-CM | POA: Diagnosis not present

## 2022-02-27 DIAGNOSIS — R1310 Dysphagia, unspecified: Secondary | ICD-10-CM | POA: Diagnosis not present

## 2022-02-27 DIAGNOSIS — R197 Diarrhea, unspecified: Secondary | ICD-10-CM | POA: Diagnosis not present

## 2022-02-27 DIAGNOSIS — E041 Nontoxic single thyroid nodule: Secondary | ICD-10-CM | POA: Diagnosis not present

## 2022-02-27 DIAGNOSIS — Z8719 Personal history of other diseases of the digestive system: Secondary | ICD-10-CM | POA: Diagnosis not present

## 2022-02-27 DIAGNOSIS — K219 Gastro-esophageal reflux disease without esophagitis: Secondary | ICD-10-CM | POA: Diagnosis not present

## 2022-02-27 DIAGNOSIS — E065 Other chronic thyroiditis: Secondary | ICD-10-CM | POA: Diagnosis not present

## 2022-03-28 DIAGNOSIS — E782 Mixed hyperlipidemia: Secondary | ICD-10-CM | POA: Diagnosis not present

## 2022-03-29 DIAGNOSIS — M79641 Pain in right hand: Secondary | ICD-10-CM | POA: Diagnosis not present

## 2022-03-29 DIAGNOSIS — M79672 Pain in left foot: Secondary | ICD-10-CM | POA: Diagnosis not present

## 2022-03-29 DIAGNOSIS — M79642 Pain in left hand: Secondary | ICD-10-CM | POA: Diagnosis not present

## 2022-03-29 DIAGNOSIS — M1991 Primary osteoarthritis, unspecified site: Secondary | ICD-10-CM | POA: Diagnosis not present

## 2022-04-04 ENCOUNTER — Telehealth: Payer: Self-pay

## 2022-04-04 NOTE — Telephone Encounter (Signed)
REFERRAL ONLY ?

## 2022-04-05 ENCOUNTER — Telehealth: Payer: Self-pay

## 2022-04-05 NOTE — Telephone Encounter (Signed)
NOTES SCANNED TO REFERRAL 

## 2022-04-10 ENCOUNTER — Telehealth: Payer: Self-pay

## 2022-04-10 NOTE — Telephone Encounter (Signed)
NOTES SCANNED TO REFERRAL 

## 2022-04-11 ENCOUNTER — Ambulatory Visit: Payer: BC Managed Care – PPO | Attending: Cardiology | Admitting: Cardiology

## 2022-04-11 VITALS — BP 118/74 | HR 65 | Ht 59.0 in | Wt 133.4 lb

## 2022-04-11 DIAGNOSIS — E7800 Pure hypercholesterolemia, unspecified: Secondary | ICD-10-CM | POA: Diagnosis not present

## 2022-04-11 DIAGNOSIS — Z8249 Family history of ischemic heart disease and other diseases of the circulatory system: Secondary | ICD-10-CM | POA: Diagnosis not present

## 2022-04-11 NOTE — Patient Instructions (Signed)
Medication Instructions:  Your physician recommends that you continue on your current medications as directed. Please refer to the Current Medication list given to you today.  *If you need a refill on your cardiac medications before your next appointment, please call your pharmacy*   Lab Work: TODAY: apolipoprotein B, lipoprotein A If you have labs (blood work) drawn today and your tests are completely normal, you will receive your results only by: MyChart Message (if you have MyChart) OR A paper copy in the mail If you have any lab test that is abnormal or we need to change your treatment, we will call you to review the results.   Testing/Procedures: Your physician has requested that you have a calcium score CT scan. There is a $99 fee for the scan.    Follow-Up: At [facility], you and your health needs are our priority.  As part of our continuing mission to provide you with exceptional heart care, we have created designated Provider Care Teams.  These Care Teams include your primary Cardiologist (physician) and Advanced Practice Providers (APPs -  Physician Assistants and Nurse Practitioners) who all work together to provide you with the care you need, when you need it.  Your next appointment:   1 year(s)  The format for your next appointment:   In Person  Provider:   Armanda Magic, MD    Other Instructions You have been referred to see PharmD in the [facility]  Important Information About Sugar

## 2022-04-11 NOTE — Progress Notes (Signed)
Cardiology CONSULT Note    Date:  04/11/2022   ID:  Jody Burns, DOB Dec 04, 1960, MRN [id number]  PCP:  Sigmund Hazel, MD  Cardiologist:  Armanda Magic, MD   Chief Complaint  Patient presents with   New Patient (Initial Visit)    Family history of premature CAD as well as hyperlipidemia statin intolerant    History of Present Illness:  Jody Burns is a 61 y.o. female who is being seen today for the evaluation of hyperlipidemia at the request of Derryl Harbor, Darci Current, PA*.  This is a 61 year old female with a history of anemia, asthma, GERD, hyperlipidemia, obesity and vitamin D deficiency who is referred due to high herpes EP due to strong family history of cardiovascular disease as well as hyperlipidemia with statin intolerance.  She tried 2 different statins which resulted in severe HAs.  She was placed on Zetia but has not helped much.  She also has a history of elevated LFTs in the past due to fatty liver noted on CT of the abdomen.  Her last labs 12/26/2021 showed ALT of 24, LDL 144, HDL 65 and total cholesterol 234.  TSH was 3.48.  She tells me that her grandfather died of a heart attack at 87 and her father had a heart attack at 77 and then 3 additional MIs as well as having PAD.  She has a brother who had heart attack at 15 as well.  There was concern that she may have a genetic component to her hyperlipidemia.  She denies any chest pain or pressure, shortness of breath, PND orthopnea, lower extremity edema, dizziness or syncope.  She does occasionally have a flip flop in her chest 1-2 times weekly but only for a microsecond.   Past Medical History:  Diagnosis Date   Abnormal results of function studies of other organs and systems    Actinic keratosis    Anemia    Asthma    Elevated liver enzymes    Gastritis    GERD (gastroesophageal reflux disease)    H/O colectomy    H/O: hysterectomy    Hyperlipidemia    Interstitial cystitis    LUQ abdominal pain    Lynch  syndrome    Over weight    Perforated sigmoid colon (HCC)    Schatzki's ring    Vitamin D deficiency     Past Surgical History:  Procedure Laterality Date   ABDOMINAL HYSTERECTOMY     APPENDECTOMY     ARTHRODESIS METATARSALPHALANGEAL JOINT (MTPJ) Right 12/25/2019   Procedure: Right hallux metatarsal phalangeal joint arthrodesis;  Surgeon: Toni Arthurs, MD;  Location: [facility];  Service: Orthopedics;  Laterality: Right;   CERVICAL FUSION     CESAREAN SECTION     CHOLECYSTECTOMY     FOOT SURGERY Left    TOTAL COLECTOMY     WEIL OSTEOTOMY Right 12/25/2019   Procedure: 2nd metatarsal Weil Osteotomy;  Surgeon: Toni Arthurs, MD;  Location: [facility];  Service: Orthopedics;  Laterality: Right;    Current Medications: Current Meds  Medication Sig   albuterol (VENTOLIN HFA) 108 (90 Base) MCG/ACT inhaler Inhale 2 puffs into the lungs every 6 (six) hours as needed for wheezing or shortness of breath.   calcium carbonate (OS-CAL - DOSED IN MG OF ELEMENTAL CALCIUM) 1250 (500 Ca) MG tablet Take 1 tablet by mouth daily with breakfast. 2000 MG   calcium-vitamin D (OSCAL WITH D) 500-200 MG-UNIT tablet Take 1 tablet by  mouth daily with breakfast. 2000 MG   ezetimibe (ZETIA) 10 MG tablet Take 1 tablet by mouth daily.   ferrous sulfate 324 MG TBEC Take 324 mg by mouth.   omeprazole (PRILOSEC) 10 MG capsule Take 10 mg by mouth daily.   ondansetron (ZOFRAN) 4 MG tablet Take 4 mg by mouth every 8 (eight) hours as needed for nausea or vomiting.   Probiotic Product (PROBIOTIC ADVANCED PO) Take by mouth.   vitamin B-12 (CYANOCOBALAMIN) 500 MCG tablet Take 500 mcg by mouth daily.    Allergies:   Bactrim [sulfamethoxazole-trimethoprim], Ceftin [cefuroxime], Dilaudid [hydromorphone], Lorcet [hydrocodone-acetaminophen], Soy allergy, and Sulfa antibiotics   Social History   Socioeconomic History   Marital status: Married    Spouse name: Not on file   Number of children: Not  on file   Years of education: Not on file   Highest education level: Not on file  Occupational History   Not on file  Tobacco Use   Smoking status: Never   Smokeless tobacco: Never  Substance and Sexual Activity   Alcohol use: Not Currently    Comment: occ.    Drug use: Never   Sexual activity: Not on file  Other Topics Concern   Not on file  Social History Narrative   Not on file   Social Determinants of Health   Financial Resource Strain: Not on file  Food Insecurity: Not on file  Transportation Needs: Not on file  Physical Activity: Not on file  Stress: Not on file  Social Connections: Not on file     Family History:  The patient's family history includes Congestive Heart Failure in her maternal grandmother; Heart attack in her brother, father, and paternal grandfather; Lung cancer in her mother; Peripheral Artery Disease in her father.   ROS:   Please see the history of present illness.    ROS All other systems reviewed and are negative.      No data to display             PHYSICAL EXAM:   VS:  BP 118/74   Pulse 65   Ht 4\' 11"  (1.499 m)   Wt 133 lb 6.4 oz (60.5 kg)   SpO2 98%   BMI 26.94 kg/m    GEN: Well nourished, well developed, in no acute distress  HEENT: normal  Neck: no JVD, carotid bruits, or masses Cardiac: RRR; no murmurs, rubs, or gallops,no edema.  Intact distal pulses bilaterally.  Respiratory:  clear to auscultation bilaterally, normal work of breathing GI: soft, nontender, nondistended, + BS MS: no deformity or atrophy  Skin: warm and dry, no rash Neuro:  Alert and Oriented x 3, Strength and sensation are intact Psych: euthymic mood, full affect  Wt Readings from Last 3 Encounters:  04/11/22 133 lb 6.4 oz (60.5 kg)  12/25/19 138 lb 0.1 oz (62.6 kg)      Studies/Labs Reviewed:   EKG:  EKG is ordered today.  The ekg ordered today demonstrates NSR with no ST changes  Recent Labs: No results found for requested labs within last  365 days.   Lipid Panel No results found for: "CHOL", "TRIG", "HDL", "CHOLHDL", "VLDL", "LDLCALC", "LDLDIRECT"   CHA2DS2-VASc Score =     This indicates a  % annual risk of stroke. The patient's score is based upon:             Additional studies/ records that were reviewed today include:  Office visit notes from PCP    ASSESSMENT:  1. Family history of premature CAD   2. Pure hypercholesterolemia      PLAN:  In order of problems listed above:  Family history of premature CAD -She is asymptomatic but has multiple family members with very early CAD -Given her hyperlipidemia she would benefit from coronary calcium score which I will order to assess future risk  2.  Hyperlipidemia -She has been statin Intolerant in the past to 2 statins and Zetia does not seem to work -Her last lipids done 12/31/2021 showed a total cholesterol of 234, LDL 144 and HDL 65. -She does have a history of elevated LFTs in the past with fatty liver on CT but last LFTs were normal. -I will check an Lp(a) and ApoB -Refer to lipid clinic for consideration of PCSK9i  Time Spent: 20 minutes total time of encounter, including 15 minutes spent in face-to-face patient care on the date of this encounter. This time includes coordination of care and counseling regarding above mentioned problem list. Remainder of non-face-to-face time involved reviewing chart documents/testing relevant to the patient encounter and documentation in the medical record. I have independently reviewed documentation from referring provider  Medication Adjustments/Labs and Tests Ordered: Current medicines are reviewed at length with the patient today.  Concerns regarding medicines are outlined above.  Medication changes, Labs and Tests ordered today are listed in the Patient Instructions below.  There are no Patient Instructions on file for this visit.   Signed, Armanda Magic, MD  04/11/2022 3:52 PM    [facility]  [facility] [address] [place], [place], [place]  [address] Phone: [phone]; Fax: [phone]

## 2022-04-12 LAB — LIPOPROTEIN A (LPA): Lipoprotein (a): 26.2 nmol/L (ref <75.0)

## 2022-04-12 LAB — APOLIPOPROTEIN B: Apolipoprotein B: 110 mg/dL — ABNORMAL HIGH (ref <90)

## 2022-04-18 ENCOUNTER — Ambulatory Visit: Payer: BC Managed Care – PPO | Attending: Cardiovascular Disease | Admitting: Pharmacist

## 2022-04-18 ENCOUNTER — Telehealth: Payer: Self-pay | Admitting: Pharmacist

## 2022-04-18 DIAGNOSIS — E785 Hyperlipidemia, unspecified: Secondary | ICD-10-CM

## 2022-04-18 DIAGNOSIS — Z8249 Family history of ischemic heart disease and other diseases of the circulatory system: Secondary | ICD-10-CM

## 2022-04-18 MED ORDER — REPATHA SURECLICK 140 MG/ML ~~LOC~~ SOAJ: 1.0000 | SUBCUTANEOUS | 11 refills | Status: DC

## 2022-04-18 NOTE — Patient Instructions (Addendum)
I will submit a prior authorization for Repatha Continue taking ezetimibe 10mg  daily  Please call me at [phone] with any questions  Please try to add some resistance training to your workouts.

## 2022-04-18 NOTE — Telephone Encounter (Signed)
Repatha approved though 10/17/22. Pt made aware. Rx sent to pharmacy. Labs scheduled for Nov.

## 2022-04-18 NOTE — Progress Notes (Signed)
Patient ID: Jody Burns                 DOB: 02-26-1961                    MRN: [id number]      HPI: Jody Burns is a 61 y.o. female patient referred to lipid clinic by Dr. Mayford Knife. PMH is significant for family history of premature CAD, HLD, statin intolerance, asthma, GERD, colectomy, fatty liver and obesity. Patient has tried 2 statins in the past both causing headaches. She also has history of elevated LFT.   Patient presents today to lipid clinic. She has several great questions about her cholesterol and risk. Patient has a strong family history of premature CAD. She personally has had a perforated colon w/ eventual colectomy due to lynch syndrome and hysterectomy. Wants to make sure medications will be absorbed. Has CAC score planned. Exercises regularly and tries to eat healthy.  Current Medications: ezetimibe 10mg  daily Intolerances: rosuvastatin, atorvastatin (headaches, muscle aches) Risk Factors: family hx of premature CAD LDL goal: depends on CAC score ApoB <90, LDL- C <100  Diet: tries to cut sugar out, no red meat, lots of fruits and vegetables, lot of vegetarian meals, high protein yogurts  Exercise: walks 3 miles about 5 days a week, some resistance training w/ bands  Family History: She tells me that her grandfather died of a heart attack at 31 and her father had a heart attack at 17 and then 3 additional MIs as well as having PAD.  She has a brother who had heart attack at 35 as well.   Social History: never smoked, 1-2 ETOH per month  Labs: 04/08/22 ApoB 110, LPa 26 (ezetimibe 10mg  daily) 12/26/21  ALT of 24, LDL-C 144, HDL 65 and total cholesterol 234 (ezetimibe 10mg  daily)  Past Medical History:  Diagnosis Date   Abnormal results of function studies of other organs and systems    Actinic keratosis    Anemia    Asthma    Elevated liver enzymes    Gastritis    GERD (gastroesophageal reflux disease)    H/O colectomy    H/O: hysterectomy    Hyperlipidemia     Interstitial cystitis    LUQ abdominal pain    Lynch syndrome    Over weight    Perforated sigmoid colon (HCC)    Schatzki's ring    Vitamin D deficiency     Current Outpatient Medications on File Prior to Visit  Medication Sig Dispense Refill   albuterol (VENTOLIN HFA) 108 (90 Base) MCG/ACT inhaler Inhale 2 puffs into the lungs every 6 (six) hours as needed for wheezing or shortness of breath.     calcium carbonate (OS-CAL - DOSED IN MG OF ELEMENTAL CALCIUM) 1250 (500 Ca) MG tablet Take 1 tablet by mouth daily with breakfast. 2000 MG     calcium-vitamin D (OSCAL WITH D) 500-200 MG-UNIT tablet Take 1 tablet by mouth daily with breakfast. 2000 MG     Collagen-Vitamin C 1000-10 MG TABS Take 2,500 mg by mouth daily.     ezetimibe (ZETIA) 10 MG tablet Take 1 tablet by mouth daily.     ferrous sulfate 324 MG TBEC Take 324 mg by mouth.     omeprazole (PRILOSEC) 10 MG capsule Take 10 mg by mouth daily.     ondansetron (ZOFRAN) 4 MG tablet Take 4 mg by mouth every 8 (eight) hours as needed for nausea or vomiting.  Probiotic Product (PROBIOTIC ADVANCED PO) Take by mouth.     vitamin B-12 (CYANOCOBALAMIN) 500 MCG tablet Take 500 mcg by mouth daily.     No current facility-administered medications on file prior to visit.    Allergies  Allergen Reactions   Bactrim [Sulfamethoxazole-Trimethoprim]    Ceftin [Cefuroxime] Hives   Dilaudid [Hydromorphone] Nausea And Vomiting   Lorcet [Hydrocodone-Acetaminophen] Hives   Soy Allergy    Sulfa Antibiotics     Assessment/Plan:  1. Hyperlipidemia - LDL-C and ApoB are above goal of <100 and <90 respectively. We did talk about how those goals could change with CAC score or by choice if she desires to treat more aggressively. She prefers not to try another statin due to the headaches she got when taking rosuvastatin and atorvastatin. We discussed PCSK9i, cost, side effects and injection technique. I will submit a prior authorization for Repatha.  Patient advised to go to [email or web address] to get a copay card. Will recheck labs in 2-3 months.  We discussed the 5 modifiable risk factors for CVD (lipids, BP, Tobacco use, diet and exercise) BP is well controlled, patient does not use tobacco. Lipids discussed above. We discussed the small role diet and exercise have on lipid profile. I have encouraged patient to eat a diet of real food, ie avoid processed foods (ie most foods with a nutrition label) and watch for added sugars (yogurt, ketchup, salad dressings are big culprits) and sodium. Limit alcohol and no sugary beverages (soda, sweet tea, juice). I have encouraged patient to continue with exercise. Recommended she add strength/resistance training at least 2-3 days a week to help with longevity and osteoporosis.     Thank you,   Olene Floss, Pharm.D, BCPS, CPP [facility] [address] [place], [place] [address]  Phone: [phone]; Fax: [phone]

## 2022-05-04 DIAGNOSIS — L820 Inflamed seborrheic keratosis: Secondary | ICD-10-CM | POA: Diagnosis not present

## 2022-05-04 DIAGNOSIS — D485 Neoplasm of uncertain behavior of skin: Secondary | ICD-10-CM | POA: Diagnosis not present

## 2022-05-04 DIAGNOSIS — L814 Other melanin hyperpigmentation: Secondary | ICD-10-CM | POA: Diagnosis not present

## 2022-05-04 DIAGNOSIS — L989 Disorder of the skin and subcutaneous tissue, unspecified: Secondary | ICD-10-CM | POA: Diagnosis not present

## 2022-05-04 DIAGNOSIS — L538 Other specified erythematous conditions: Secondary | ICD-10-CM | POA: Diagnosis not present

## 2022-05-04 DIAGNOSIS — L821 Other seborrheic keratosis: Secondary | ICD-10-CM | POA: Diagnosis not present

## 2022-05-04 DIAGNOSIS — L570 Actinic keratosis: Secondary | ICD-10-CM | POA: Diagnosis not present

## 2022-05-04 DIAGNOSIS — D225 Melanocytic nevi of trunk: Secondary | ICD-10-CM | POA: Diagnosis not present

## 2022-05-09 ENCOUNTER — Ambulatory Visit (HOSPITAL_BASED_OUTPATIENT_CLINIC_OR_DEPARTMENT_OTHER)
Admission: RE | Admit: 2022-05-09 | Discharge: 2022-05-09 | Disposition: A | Discharge status: Home / Self Care | Payer: BC Managed Care – PPO | Source: Ambulatory Visit | Attending: Cardiology | Admitting: Cardiology

## 2022-05-09 DIAGNOSIS — E7800 Pure hypercholesterolemia, unspecified: Secondary | ICD-10-CM | POA: Insufficient documentation

## 2022-05-09 DIAGNOSIS — Z8249 Family history of ischemic heart disease and other diseases of the circulatory system: Secondary | ICD-10-CM | POA: Insufficient documentation

## 2022-05-15 DIAGNOSIS — Z1231 Encounter for screening mammogram for malignant neoplasm of breast: Secondary | ICD-10-CM | POA: Diagnosis not present

## 2022-05-31 DIAGNOSIS — J029 Acute pharyngitis, unspecified: Secondary | ICD-10-CM | POA: Diagnosis not present

## 2022-05-31 DIAGNOSIS — R42 Dizziness and giddiness: Secondary | ICD-10-CM | POA: Diagnosis not present

## 2022-05-31 DIAGNOSIS — J0100 Acute maxillary sinusitis, unspecified: Secondary | ICD-10-CM | POA: Diagnosis not present

## 2022-06-28 ENCOUNTER — Ambulatory Visit: Payer: BC Managed Care – PPO | Attending: Cardiology

## 2022-06-28 DIAGNOSIS — E785 Hyperlipidemia, unspecified: Secondary | ICD-10-CM | POA: Diagnosis not present

## 2022-06-29 LAB — LIPID PANEL
Chol/HDL Ratio: 2 ratio (ref 0.0–4.4)
Cholesterol, Total: 141 mg/dL (ref 100–199)
HDL: 70 mg/dL (ref >39)
LDL Chol Calc (NIH): 50 mg/dL (ref 0–99)
Triglycerides: 122 mg/dL (ref 0–149)
VLDL Cholesterol Cal: 21 mg/dL (ref 5–40)

## 2022-06-29 LAB — APOLIPOPROTEIN B: Apolipoprotein B: 55 mg/dL (ref <90)

## 2022-07-31 DIAGNOSIS — K5289 Other specified noninfective gastroenteritis and colitis: Secondary | ICD-10-CM | POA: Diagnosis not present

## 2022-07-31 DIAGNOSIS — K219 Gastro-esophageal reflux disease without esophagitis: Secondary | ICD-10-CM | POA: Diagnosis not present

## 2022-07-31 DIAGNOSIS — Z9889 Other specified postprocedural states: Secondary | ICD-10-CM | POA: Diagnosis not present

## 2022-07-31 DIAGNOSIS — K319 Disease of stomach and duodenum, unspecified: Secondary | ICD-10-CM | POA: Diagnosis not present

## 2022-07-31 DIAGNOSIS — K644 Residual hemorrhoidal skin tags: Secondary | ICD-10-CM | POA: Diagnosis not present

## 2022-07-31 DIAGNOSIS — K3189 Other diseases of stomach and duodenum: Secondary | ICD-10-CM | POA: Diagnosis not present

## 2022-07-31 DIAGNOSIS — Z1509 Genetic susceptibility to other malignant neoplasm: Secondary | ICD-10-CM | POA: Diagnosis not present

## 2022-07-31 DIAGNOSIS — K2970 Gastritis, unspecified, without bleeding: Secondary | ICD-10-CM | POA: Diagnosis not present

## 2022-07-31 DIAGNOSIS — Z980 Intestinal bypass and anastomosis status: Secondary | ICD-10-CM | POA: Diagnosis not present

## 2022-07-31 DIAGNOSIS — K648 Other hemorrhoids: Secondary | ICD-10-CM | POA: Diagnosis not present

## 2022-07-31 DIAGNOSIS — K91850 Pouchitis: Secondary | ICD-10-CM | POA: Diagnosis not present

## 2022-07-31 DIAGNOSIS — Y832 Surgical operation with anastomosis, bypass or graft as the cause of abnormal reaction of the patient, or of later complication, without mention of misadventure at the time of the procedure: Secondary | ICD-10-CM | POA: Diagnosis not present

## 2022-07-31 DIAGNOSIS — K449 Diaphragmatic hernia without obstruction or gangrene: Secondary | ICD-10-CM | POA: Diagnosis not present

## 2022-07-31 DIAGNOSIS — K222 Esophageal obstruction: Secondary | ICD-10-CM | POA: Diagnosis not present

## 2022-07-31 DIAGNOSIS — K9189 Other postprocedural complications and disorders of digestive system: Secondary | ICD-10-CM | POA: Diagnosis not present

## 2022-07-31 DIAGNOSIS — R1319 Other dysphagia: Secondary | ICD-10-CM | POA: Diagnosis not present

## 2022-10-04 ENCOUNTER — Ambulatory Visit
Admission: RE | Admit: 2022-10-04 | Discharge: 2022-10-04 | Disposition: A | Discharge status: Home / Self Care | Payer: BC Managed Care – PPO | Source: Ambulatory Visit | Attending: Internal Medicine | Admitting: Internal Medicine

## 2022-10-04 DIAGNOSIS — M818 Other osteoporosis without current pathological fracture: Secondary | ICD-10-CM

## 2022-10-04 DIAGNOSIS — M810 Age-related osteoporosis without current pathological fracture: Secondary | ICD-10-CM | POA: Diagnosis not present

## 2022-10-04 DIAGNOSIS — Z780 Asymptomatic menopausal state: Secondary | ICD-10-CM | POA: Diagnosis not present

## 2022-10-05 ENCOUNTER — Encounter: Payer: Self-pay | Admitting: Pharmacist

## 2022-10-05 DIAGNOSIS — E538 Deficiency of other specified B group vitamins: Secondary | ICD-10-CM | POA: Diagnosis not present

## 2022-10-05 DIAGNOSIS — Z23 Encounter for immunization: Secondary | ICD-10-CM | POA: Diagnosis not present

## 2022-10-05 DIAGNOSIS — E782 Mixed hyperlipidemia: Secondary | ICD-10-CM | POA: Diagnosis not present

## 2022-10-05 DIAGNOSIS — Z0000 Encounter for general adult medical examination without abnormal findings: Secondary | ICD-10-CM | POA: Diagnosis not present

## 2022-10-05 DIAGNOSIS — E559 Vitamin D deficiency, unspecified: Secondary | ICD-10-CM | POA: Diagnosis not present

## 2022-11-09 DIAGNOSIS — L821 Other seborrheic keratosis: Secondary | ICD-10-CM | POA: Diagnosis not present

## 2022-11-09 DIAGNOSIS — L814 Other melanin hyperpigmentation: Secondary | ICD-10-CM | POA: Diagnosis not present

## 2022-11-09 DIAGNOSIS — L570 Actinic keratosis: Secondary | ICD-10-CM | POA: Diagnosis not present

## 2022-11-09 DIAGNOSIS — D225 Melanocytic nevi of trunk: Secondary | ICD-10-CM | POA: Diagnosis not present

## 2022-11-21 DIAGNOSIS — E559 Vitamin D deficiency, unspecified: Secondary | ICD-10-CM | POA: Diagnosis not present

## 2022-11-21 DIAGNOSIS — E28319 Asymptomatic premature menopause: Secondary | ICD-10-CM | POA: Diagnosis not present

## 2022-11-21 DIAGNOSIS — M818 Other osteoporosis without current pathological fracture: Secondary | ICD-10-CM | POA: Diagnosis not present

## 2022-11-21 DIAGNOSIS — Z1509 Genetic susceptibility to other malignant neoplasm: Secondary | ICD-10-CM | POA: Diagnosis not present

## 2022-11-22 ENCOUNTER — Encounter: Payer: Self-pay | Admitting: Internal Medicine

## 2022-11-22 DIAGNOSIS — M810 Age-related osteoporosis without current pathological fracture: Secondary | ICD-10-CM

## 2022-11-28 DIAGNOSIS — E041 Nontoxic single thyroid nodule: Secondary | ICD-10-CM | POA: Diagnosis not present

## 2022-11-28 DIAGNOSIS — E042 Nontoxic multinodular goiter: Secondary | ICD-10-CM | POA: Diagnosis not present

## 2022-11-28 DIAGNOSIS — E559 Vitamin D deficiency, unspecified: Secondary | ICD-10-CM | POA: Diagnosis not present

## 2022-11-28 DIAGNOSIS — M818 Other osteoporosis without current pathological fracture: Secondary | ICD-10-CM | POA: Diagnosis not present

## 2022-11-30 DIAGNOSIS — H1031 Unspecified acute conjunctivitis, right eye: Secondary | ICD-10-CM | POA: Diagnosis not present

## 2022-12-18 DIAGNOSIS — J029 Acute pharyngitis, unspecified: Secondary | ICD-10-CM | POA: Diagnosis not present

## 2023-01-11 DIAGNOSIS — I788 Other diseases of capillaries: Secondary | ICD-10-CM | POA: Diagnosis not present

## 2023-01-11 DIAGNOSIS — L570 Actinic keratosis: Secondary | ICD-10-CM | POA: Diagnosis not present

## 2023-01-11 DIAGNOSIS — L578 Other skin changes due to chronic exposure to nonionizing radiation: Secondary | ICD-10-CM | POA: Diagnosis not present

## 2023-01-11 DIAGNOSIS — Z09 Encounter for follow-up examination after completed treatment for conditions other than malignant neoplasm: Secondary | ICD-10-CM | POA: Diagnosis not present

## 2023-02-01 DIAGNOSIS — K91850 Pouchitis: Secondary | ICD-10-CM | POA: Diagnosis not present

## 2023-03-27 DIAGNOSIS — L0390 Cellulitis, unspecified: Secondary | ICD-10-CM | POA: Diagnosis not present

## 2023-03-27 DIAGNOSIS — H9313 Tinnitus, bilateral: Secondary | ICD-10-CM | POA: Diagnosis not present

## 2023-03-27 DIAGNOSIS — H903 Sensorineural hearing loss, bilateral: Secondary | ICD-10-CM | POA: Diagnosis not present

## 2023-04-18 NOTE — Progress Notes (Unsigned)
Cardiology Office Note:  .   Date:  04/19/2023  ID:  Jody Burns, DOB 10-Feb-1961, MRN [id number] PCP: Tamala Ser   [facility] Providers Cardiologist:  Armanda Magic, MD {   History of Present Illness: Jody Burns is a 62 y.o. female who is being seen for evaluation of hyperlipidemia.  History includes anemia, asthma, GERD, hyperlipidemia, obesity, and vitamin D deficiency who was referred due to strong family history of cardiovascular disease as well as hyperlipidemia with statin intolerance.  She tried 2 different statins which resulted in severe headaches.  She was placed on Zetia with a had not helped much.  She also had a history of elevated LFTs in the past due to fatty liver noted on CT of the abdomen.  Last lab work May 2023 showed ALT of 24, LDL 144, HLD 65 and total cholesterol 234.  TSH was 3.48.  Patient was seen August of last year and she shared that her grandfather died of heart attack 38 and her father had a heart attack at 53 and then 3 additional MIs as well as having PAD.  She also has brother had a heart attack at 27 as well.  There was concern that she may have a genetic component to her hyperlipidemia.  Denied chest pain/pressure, SOB, PND, orthopnea, lower extremity edema, dizziness or syncope.  Occasionally does have a flip-flop sensation in her chest 1-2 times a week but only for microsecond.  Today, she tells me that she has had palpitations the last few days several times a day.  Before that only occurred a few times a week.  Would like to hold off on any medication changes for now.  She was on statins and had terrible headaches.  Started on Zetia which did not provide enough LDL reduction.  She was then started on Repatha and LDL got down to goal when last checked in February however, now having horrible headaches and tinnitus that may or may not be related so she has discontinued Repatha.  Discussed Leqvio.  She is really hesitant due to  her bad reactions in the past medications.  She saw Melissa in the past and would like to discuss with her before initiating.   She is asked me if we can check a lipid panel and LFTs today, which we have ordered   She has a J-pouch and was recently started on mesalamine 2.4g in the AM. Feeling much better.    ROS: Pertinent ROS in HPI  Studies Reviewed: Marland Kitchen   EKG Interpretation Date/Time:  Thursday April 19 2023 11:15:28 EDT Ventricular Rate:  57 PR Interval:  206 QRS Duration:  88 QT Interval:  422 QTC Calculation: 410 R Axis:   129  Text Interpretation: Sinus bradycardia Low voltage QRS Left posterior fascicular block No previous ECGs available Confirmed by Jari Favre [id number]) on 04/19/2023 12:09:46 PM   CT cardiac calcium score 05/09/2022 Narrative & Impression  CLINICAL DATA:  Cardiovascular disease risk stratification   CAD screening, low CAD risk   EXAM: CT Coronary Calcium Score   TECHNIQUE: A gated, non-contrast computed tomography scan of the heart was performed using 3mm slice thickness. Axial images were analyzed on a dedicated workstation. Calcium scoring of the coronary arteries was performed using the Agatston method.   FINDINGS: Coronary Calcium Score:   Left main: 0   Left anterior descending artery: 0   Left circumflex artery: 0   Right coronary artery: 0   Total:  0   Pericardium: Normal.   Ascending Aorta: Normal caliber. Ascending aorta measures approximately 26mm at the mid ascending aorta measured in an axial plane.   Non-cardiac: See separate report from [facility].   IMPRESSION: Coronary calcium score of 0.     Physical Exam:   VS:  BP 108/68   Pulse (!) 57   Ht 4\' 11"  (1.499 m)   Wt 134 lb 12.8 oz (61.1 kg)   SpO2 98%   BMI 27.23 kg/m    Wt Readings from Last 3 Encounters:  04/19/23 134 lb 12.8 oz (61.1 kg)  04/11/22 133 lb 6.4 oz (60.5 kg)  12/25/19 138 lb 0.1 oz (62.6 kg)    GEN: Well nourished, well  developed in no acute distress NECK: No JVD; No carotid bruits CARDIAC: RRR, no murmurs, rubs, gallops RESPIRATORY:  Clear to auscultation without rales, wheezing or rhonchi  ABDOMEN: Soft, non-tender, non-distended EXTREMITIES:  No edema; No deformity   ASSESSMENT AND PLAN: .   1.  Family history of premature CAD -Discussed risk reduction with diet and exercise -Plan to drive LDL down below 55 -Patient currently on Zetia alone due to intolerance of Repatha -Cardiac CT scan from last year reviewed with calcium score of 0  2.  Hyperlipidemia   -Discussed Leqvio -Patient is hesitant to start a new medication due to other medication intolerances that she has had in the past -We have arranged discussion with Malena Peer, Pharm.D.   Dispo: She can follow-up in a year with Dr. Mayford Knife  Signed, Sharlene Dory, PA-C

## 2023-04-19 ENCOUNTER — Ambulatory Visit: Payer: BC Managed Care – PPO | Attending: Physician Assistant | Admitting: Physician Assistant

## 2023-04-19 ENCOUNTER — Encounter: Payer: Self-pay | Admitting: Physician Assistant

## 2023-04-19 VITALS — BP 108/68 | HR 57 | Ht 59.0 in | Wt 134.8 lb

## 2023-04-19 DIAGNOSIS — Z8249 Family history of ischemic heart disease and other diseases of the circulatory system: Secondary | ICD-10-CM

## 2023-04-19 DIAGNOSIS — E785 Hyperlipidemia, unspecified: Secondary | ICD-10-CM | POA: Diagnosis not present

## 2023-04-19 NOTE — Patient Instructions (Addendum)
Medication Instructions:   Your physician recommends that you continue on your current medications as directed. Please refer to the Current Medication list given to you today.  *If you need a refill on your cardiac medications before your next appointment, please call your pharmacy*   Lab Work:  LFT AND LIPIDS   If you have labs (blood work) drawn today and your tests are completely normal, you will receive your results only by: MyChart Message (if you have MyChart) OR A paper copy in the mail If you have any lab test that is abnormal or we need to change your treatment, we will call you to review the results.   Testing/Procedures: NONE ORDERED  TODAY     Follow-Up: At [facility], you and your health needs are our priority.  As part of our continuing mission to provide you with exceptional heart care, we have created designated Provider Care Teams.  These Care Teams include your primary Cardiologist (physician) and Advanced Practice Providers (APPs -  Physician Assistants and Nurse Practitioners) who all work together to provide you with the care you need, when you need it.  We recommend signing up for the patient portal called "MyChart".  Sign up information is provided on this After Visit Summary.  MyChart is used to connect with patients for Virtual Visits (Telemedicine).  Patients are able to view lab/test results, encounter notes, upcoming appointments, etc.  Non-urgent messages can be sent to your provider as well.   To learn more about what you can do with MyChart, go to [email or web address].    Your next appointment:  IN 2 WEEKS WITH MELISSA MACCIA PHARM D  1 year(s)  Provider:   Armanda Magic, MD     Other Instructions

## 2023-04-26 LAB — HEPATIC FUNCTION PANEL
ALT: 19 IU/L (ref 0–32)
AST: 28 IU/L (ref 0–40)
Albumin: 4.2 g/dL (ref 3.8–4.9)
Alkaline Phosphatase: 102 IU/L (ref 44–121)
Bilirubin Total: 0.2 mg/dL (ref 0.0–1.2)
Total Protein: 6.7 g/dL (ref 6.0–8.5)

## 2023-04-26 LAB — LIPID PANEL
Chol/HDL Ratio: 3.4 ratio (ref 0.0–4.4)
Cholesterol, Total: 214 mg/dL — ABNORMAL HIGH (ref 100–199)
HDL: 63 mg/dL (ref >39)
LDL Chol Calc (NIH): 131 mg/dL — ABNORMAL HIGH (ref 0–99)
Triglycerides: 116 mg/dL (ref 0–149)
VLDL Cholesterol Cal: 20 mg/dL (ref 5–40)

## 2023-05-03 ENCOUNTER — Ambulatory Visit: Payer: BC Managed Care – PPO | Attending: Cardiovascular Disease | Admitting: Pharmacist

## 2023-05-03 DIAGNOSIS — E785 Hyperlipidemia, unspecified: Secondary | ICD-10-CM

## 2023-05-03 NOTE — Progress Notes (Signed)
Patient ID: Jody Burns                 DOB: 08-21-1960                    MRN: [id number]      HPI: Jody Burns is a 62 y.o. female patient referred to lipid clinic by Dr. Mayford Knife. PMH is significant for family history of premature CAD, HLD, statin intolerance, asthma, GERD, colectomy, fatty liver and obesity. Patient has tried 2 statins in the past both causing headaches. She also has history of elevated LFT, probably due to fatty liver. CAC in 2023 was 0. I saw the patient in Sept 2023 and we started Repatha. LDL-C came down to 50.  She sent a mychart message in Feb 2024 reporting tinnitus of my ears, evening headaches, and interrupted sleep due to horrible dreams. I asked her to hold 1-2 doses to see if any improvement.   Patient presents today to lipid clinic for follow-up.  She reports that she felt much better after being off the Repatha for about a week.  Headaches and bad dreams have gone away.  She reports being very sensitive to medications but wants to be proactive in her health.  She has 3 children.  Her daughter lives in [place] and has 4 children herself.  She often goes to [place] to help her daughter out.  Taking a culinary trip to [place] soon.  Travels frequently with her husband.  She walks several miles 4-5 times a week and is also doing the physical therapy exercises she received when she had knee surgery.  We discussed both Leqvio and Nexletol.  Reviewed side effects, efficacy, trial data, cost in detail.  Current Medications: ezetimibe 10mg  daily Intolerances: rosuvastatin, atorvastatin (headaches, muscle aches) zetia (ineffective), Repatha (headaches) Risk Factors: family hx of premature CAD LDL goal:ApoB <90, LDL- C <657  Diet: tries to cut sugar out, no red meat, lots of fruits and vegetables, lot of vegetarian meals, high protein yogurts  Exercise: walks 3 miles about 4-5 days a week, some resistance training w/ bands  Family History: She  tells me that her grandfather died of a heart attack at 91 and her father had a heart attack at 30 and then 3 additional MIs as well as having PAD.  She has a brother who had heart attack at 74 as well.   Social History: never smoked, 1-2 ETOH per month  Labs: 04/19/23 TC 214, TG 116, HDL 63, LDL-C 131 (ezetimibe) 04/08/22 ApoB 110, LPa 26 (ezetimibe 10mg  daily) 12/26/21  ALT of 24, LDL-C 144, HDL 65 and total cholesterol 234 (ezetimibe 10mg  daily)  Past Medical History:  Diagnosis Date   Abnormal results of function studies of other organs and systems    Actinic keratosis    Anemia    Asthma    Elevated liver enzymes    Gastritis    GERD (gastroesophageal reflux disease)    H/O colectomy    H/O: hysterectomy    Hyperlipidemia    Interstitial cystitis    LUQ abdominal pain    Lynch syndrome    Over weight    Perforated sigmoid colon (HCC)    Schatzki's ring    Vitamin D deficiency     Current Outpatient Medications on File Prior to Visit  Medication Sig Dispense Refill   albuterol (VENTOLIN HFA) 108 (90 Base) MCG/ACT inhaler Inhale 2 puffs into the lungs every 6 (six) hours as needed  for wheezing or shortness of breath.     calcium carbonate (OS-CAL - DOSED IN MG OF ELEMENTAL CALCIUM) 1250 (500 Ca) MG tablet Take 1 tablet by mouth daily with breakfast. 2000 MG     calcium-vitamin D (OSCAL WITH D) 500-200 MG-UNIT tablet Take 1 tablet by mouth daily with breakfast. 2000 MG     Collagen-Vitamin C 1000-10 MG TABS Take 2,500 mg by mouth daily.     ezetimibe (ZETIA) 10 MG tablet Take 1 tablet by mouth daily.     ferrous sulfate 324 MG TBEC Take 324 mg by mouth.     mesalamine (LIALDA) 1.2 g EC tablet Take 2.4 g by mouth daily with breakfast.     omeprazole (PRILOSEC) 10 MG capsule Take 10 mg by mouth daily.     ondansetron (ZOFRAN) 4 MG tablet Take 4 mg by mouth every 8 (eight) hours as needed for nausea or vomiting.     Probiotic Product (PROBIOTIC ADVANCED PO) Take by mouth.      vitamin B-12 (CYANOCOBALAMIN) 500 MCG tablet Take 500 mcg by mouth daily.     No current facility-administered medications on file prior to visit.    Allergies  Allergen Reactions   Bactrim [Sulfamethoxazole-Trimethoprim]    Ceftin [Cefuroxime] Hives   Dilaudid [Hydromorphone] Nausea And Vomiting   Lorcet [Hydrocodone-Acetaminophen] Hives   Soy Allergy    Sulfa Antibiotics     Assessment/Plan:  1. Hyperlipidemia -LDL-C is above goal on just ezetimibe.  She does have a very strong family history of MI.  Her coronary calcium score last year was 0.  Reasonable to give her a LDL-C goal of less than 100 but also reasonable to try to push further.  Limited options.  We discussed Leqvio versus Nexletol in detail.  She does have concerns about side effects.  Given that patient fits nicely in the enrollment criteria for the clear outcomes trial and given that Wilber Bihari has no outcomes data available patient and I collectively had decided to try Nexlizet first.  Will submit prior authorization for Nexlizet.  Would need lab work including CMP, uric acid and lipid panel 2 to 3 months after starting.  Key: [id number]  Thank you,  Olene Floss, Pharm.D, BCACP, BCPS, CPP [facility] [address], [place], [place] [address]  Phone: [phone]; Fax: [phone]

## 2023-05-03 NOTE — Patient Instructions (Addendum)
   I will submit a prior authorization for Nexlizet. I will call you once its approved.  Please call me at [phone] with any questions

## 2023-05-07 ENCOUNTER — Telehealth: Payer: Self-pay | Admitting: Pharmacist

## 2023-05-07 DIAGNOSIS — E785 Hyperlipidemia, unspecified: Secondary | ICD-10-CM

## 2023-05-07 MED ORDER — NEXLIZET 180-10 MG PO TABS: 1.0000 | ORAL_TABLET | Freq: Every day | ORAL | 11 refills | Status: AC

## 2023-05-07 NOTE — Telephone Encounter (Signed)
Nexlizet PA (KEY [id number]) approved through 10/31/23 Called pt and LVM for her to call back Will STOP zetia when she starts Nexlizet. Will need labs scheduled.

## 2023-05-07 NOTE — Telephone Encounter (Signed)
Patient returned call. Made aware of approval. She already downloaded copay card. Rx sent to [facility]. She knows to stop zetia. Labs scheduled for 12/14.

## 2023-05-15 DIAGNOSIS — K91850 Pouchitis: Secondary | ICD-10-CM | POA: Diagnosis not present

## 2023-05-15 DIAGNOSIS — R1319 Other dysphagia: Secondary | ICD-10-CM | POA: Diagnosis not present

## 2023-05-15 DIAGNOSIS — K6289 Other specified diseases of anus and rectum: Secondary | ICD-10-CM | POA: Diagnosis not present

## 2023-05-16 ENCOUNTER — Other Ambulatory Visit: Payer: Self-pay | Admitting: Internal Medicine

## 2023-05-16 DIAGNOSIS — M810 Age-related osteoporosis without current pathological fracture: Secondary | ICD-10-CM

## 2023-05-21 DIAGNOSIS — Z1231 Encounter for screening mammogram for malignant neoplasm of breast: Secondary | ICD-10-CM | POA: Diagnosis not present

## 2023-05-24 DIAGNOSIS — M533 Sacrococcygeal disorders, not elsewhere classified: Secondary | ICD-10-CM | POA: Diagnosis not present

## 2023-05-29 DIAGNOSIS — M533 Sacrococcygeal disorders, not elsewhere classified: Secondary | ICD-10-CM | POA: Diagnosis not present

## 2023-05-31 DIAGNOSIS — L821 Other seborrheic keratosis: Secondary | ICD-10-CM | POA: Diagnosis not present

## 2023-05-31 DIAGNOSIS — L578 Other skin changes due to chronic exposure to nonionizing radiation: Secondary | ICD-10-CM | POA: Diagnosis not present

## 2023-05-31 DIAGNOSIS — D225 Melanocytic nevi of trunk: Secondary | ICD-10-CM | POA: Diagnosis not present

## 2023-05-31 DIAGNOSIS — L814 Other melanin hyperpigmentation: Secondary | ICD-10-CM | POA: Diagnosis not present

## 2023-07-01 DIAGNOSIS — Z20818 Contact with and (suspected) exposure to other bacterial communicable diseases: Secondary | ICD-10-CM | POA: Diagnosis not present

## 2023-07-01 DIAGNOSIS — R509 Fever, unspecified: Secondary | ICD-10-CM | POA: Diagnosis not present

## 2023-07-01 DIAGNOSIS — J029 Acute pharyngitis, unspecified: Secondary | ICD-10-CM | POA: Diagnosis not present

## 2023-07-03 ENCOUNTER — Other Ambulatory Visit: Payer: Self-pay | Admitting: Medical Genetics

## 2023-07-03 DIAGNOSIS — Z006 Encounter for examination for normal comparison and control in clinical research program: Secondary | ICD-10-CM

## 2023-07-28 DIAGNOSIS — S8261XA Displaced fracture of lateral malleolus of right fibula, initial encounter for closed fracture: Secondary | ICD-10-CM | POA: Diagnosis not present

## 2023-07-28 DIAGNOSIS — S82891A Other fracture of right lower leg, initial encounter for closed fracture: Secondary | ICD-10-CM | POA: Diagnosis not present

## 2023-07-28 DIAGNOSIS — S8264XA Nondisplaced fracture of lateral malleolus of right fibula, initial encounter for closed fracture: Secondary | ICD-10-CM | POA: Diagnosis not present

## 2023-07-28 DIAGNOSIS — M25571 Pain in right ankle and joints of right foot: Secondary | ICD-10-CM | POA: Diagnosis not present

## 2023-07-28 DIAGNOSIS — W1830XA Fall on same level, unspecified, initial encounter: Secondary | ICD-10-CM | POA: Diagnosis not present

## 2023-07-30 ENCOUNTER — Other Ambulatory Visit: Payer: BC Managed Care – PPO

## 2023-07-31 ENCOUNTER — Ambulatory Visit: Payer: BC Managed Care – PPO

## 2023-07-31 DIAGNOSIS — E785 Hyperlipidemia, unspecified: Secondary | ICD-10-CM

## 2023-08-01 DIAGNOSIS — S8264XA Nondisplaced fracture of lateral malleolus of right fibula, initial encounter for closed fracture: Secondary | ICD-10-CM | POA: Diagnosis not present

## 2023-08-01 DIAGNOSIS — S93401A Sprain of unspecified ligament of right ankle, initial encounter: Secondary | ICD-10-CM | POA: Diagnosis not present

## 2023-08-13 ENCOUNTER — Other Ambulatory Visit (HOSPITAL_COMMUNITY)
Admission: RE | Admit: 2023-08-13 | Discharge: 2023-08-13 | Disposition: A | Discharge status: Home / Self Care | Payer: Self-pay | Source: Ambulatory Visit | Attending: Oncology | Admitting: Oncology

## 2023-08-13 DIAGNOSIS — Z006 Encounter for examination for normal comparison and control in clinical research program: Secondary | ICD-10-CM | POA: Insufficient documentation

## 2023-08-18 ENCOUNTER — Encounter: Payer: Self-pay | Admitting: Physician Assistant

## 2023-08-20 ENCOUNTER — Other Ambulatory Visit: Payer: Self-pay | Admitting: *Deleted

## 2023-08-24 LAB — COMPREHENSIVE METABOLIC PANEL
ALT: 22 [IU]/L (ref 0–32)
AST: 31 [IU]/L (ref 0–40)
Albumin: 4.3 g/dL (ref 3.8–4.9)
Alkaline Phosphatase: 81 [IU]/L (ref 44–121)
BUN/Creatinine Ratio: 15 (ref 12–28)
BUN: 11 mg/dL (ref 8–27)
Bilirubin Total: 0.4 mg/dL (ref 0.0–1.2)
CO2: 23 mmol/L (ref 20–29)
Calcium: 9.7 mg/dL (ref 8.7–10.3)
Chloride: 102 mmol/L (ref 96–106)
Creatinine, Ser: 0.75 mg/dL (ref 0.57–1.00)
Globulin, Total: 2.3 g/dL (ref 1.5–4.5)
Glucose: 81 mg/dL (ref 70–99)
Potassium: 4.3 mmol/L (ref 3.5–5.2)
Sodium: 141 mmol/L (ref 134–144)
Total Protein: 6.6 g/dL (ref 6.0–8.5)
eGFR: 91 mL/min/{1.73_m2} (ref >59)

## 2023-08-24 LAB — LIPID PANEL
Chol/HDL Ratio: 2.6 {ratio} (ref 0.0–4.4)
Cholesterol, Total: 133 mg/dL (ref 100–199)
HDL: 51 mg/dL (ref >39)
LDL Chol Calc (NIH): 66 mg/dL (ref 0–99)
Triglycerides: 85 mg/dL (ref 0–149)
VLDL Cholesterol Cal: 16 mg/dL (ref 5–40)

## 2023-08-24 LAB — URIC ACID: Uric Acid: 4.4 mg/dL (ref 3.0–7.2)

## 2023-08-27 LAB — GENECONNECT MOLECULAR SCREEN: Genetic Analysis Overall Interpretation: NEGATIVE

## 2023-09-28 ENCOUNTER — Other Ambulatory Visit: Payer: Self-pay | Admitting: Student

## 2023-09-28 DIAGNOSIS — M25571 Pain in right ankle and joints of right foot: Secondary | ICD-10-CM

## 2023-10-03 ENCOUNTER — Telehealth: Payer: Self-pay

## 2023-10-03 ENCOUNTER — Other Ambulatory Visit (HOSPITAL_COMMUNITY): Payer: Self-pay

## 2023-10-03 NOTE — Telephone Encounter (Signed)
 Pharmacy Patient Advocate Encounter   Received notification from CoverMyMeds that prior authorization for NEXLIZET is required/requested.   Insurance verification completed.   The patient is insured through [facility] .   Per test claim: Refill too soon. PA is not needed at this time. Medication was filled 09/27/23. Next eligible fill date is 10/20/23.

## 2023-10-08 ENCOUNTER — Ambulatory Visit
Admission: RE | Admit: 2023-10-08 | Discharge: 2023-10-08 | Disposition: A | Discharge status: Home / Self Care | Payer: BC Managed Care – PPO | Source: Ambulatory Visit | Attending: Student | Admitting: Student

## 2023-10-08 DIAGNOSIS — M25571 Pain in right ankle and joints of right foot: Secondary | ICD-10-CM

## 2023-10-16 ENCOUNTER — Other Ambulatory Visit (INDEPENDENT_AMBULATORY_CARE_PROVIDER_SITE_OTHER): Payer: Self-pay

## 2023-10-16 ENCOUNTER — Ambulatory Visit: Payer: BC Managed Care – PPO | Admitting: Orthopedic Surgery

## 2023-10-16 DIAGNOSIS — M25571 Pain in right ankle and joints of right foot: Secondary | ICD-10-CM

## 2023-10-21 ENCOUNTER — Encounter: Payer: Self-pay | Admitting: Orthopedic Surgery

## 2023-10-21 NOTE — Progress Notes (Signed)
 Office Visit Note   Patient: Jody Burns           Date of Birth: 01/12/1961           MRN: [id number] Visit Date: 10/16/2023              Requested by: No referring provider defined for this encounter. PCP: Alyson Ingles, PA-C (Inactive)  Chief Complaint  Patient presents with   Right Ankle - Pain      HPI: Patient is a 63 year old woman who presents for a second opinion regarding right ankle pain and foot pain.  Patient has been in a cam boot walker and has been provided a ASO.  Patient is status post Weber a fibular fracture.  Patient denies a history of gout.  Assessment & Plan: Visit Diagnoses:  1. Pain in right ankle and joints of right foot     Plan: Clinically her symptoms seem to be associated with her osteoporosis with a T-score of -3.  Recommended following up for initiation of osteoporotic medication.  Recommended dynamic loading.  Also recommended a stiff soled sneaker with a over-the-counter orthotic with a met pad.  Follow-Up Instructions: Return if symptoms worsen or fail to improve.   Ortho Exam  Patient is alert, oriented, no adenopathy, well-dressed, normal affect, normal respiratory effort. Patient has had a DEXA bone scan with a T-score of - 16 September 2022.  Review of her MRI scan shows edema in the distal cuboid as well as edema in the distal fibula with a joint effusion.  Patient is a good dorsalis pedis pulse.  She is tender to palpation over the Achilles.  She is tender to palpation of the cuboid and tender to palpation over the distal fibula.  I have reviewed the patient's history and given the presence of a fragility fracture, I have deemed the necessity of a osteoporosis management referral or confirmed that the patient is currently enrolled in a osteoporosis treatment program.   Uric acid 4.4.  Imaging: No results found. No images are attached to the encounter.  Labs: Lab Results  Component Value Date   LABURIC 4.4 08/23/2023      Lab Results  Component Value Date   ALBUMIN 4.3 08/23/2023   ALBUMIN 4.2 04/19/2023    No results found for: "MG" No results found for: "VD25OH"  No results found for: "PREALBUMIN"     No data to display           There is no height or weight on file to calculate BMI.  Orders:  Orders Placed This Encounter  Procedures   XR Ankle Complete Right   XR Foot Complete Right   No orders of the defined types were placed in this encounter.    Procedures: No procedures performed  Clinical Data: No additional findings.  ROS:  All other systems negative, except as noted in the HPI. Review of Systems  Objective: Vital Signs: There were no vitals taken for this visit.  Specialty Comments:  No specialty comments available.  PMFS History: Patient Active Problem List   Diagnosis Date Noted   Hyperlipidemia 04/18/2022   Family history of premature CAD 04/18/2022   Menopause, premature 11/16/2020   Osteoporosis without current pathological fracture 11/16/2020   Lynch syndrome 11/16/2020   Past Medical History:  Diagnosis Date   Abnormal results of function studies of other organs and systems    Actinic keratosis    Anemia    Asthma    Elevated  liver enzymes    Gastritis    GERD (gastroesophageal reflux disease)    H/O colectomy    H/O: hysterectomy    Hyperlipidemia    Interstitial cystitis    LUQ abdominal pain    Lynch syndrome    Over weight    Perforated sigmoid colon (HCC)    Schatzki's ring    Vitamin D deficiency     Family History  Problem Relation Age of Onset   Lung cancer Mother    Heart attack Father    Peripheral Artery Disease Father    Heart attack Brother    Congestive Heart Failure Maternal Grandmother    Heart attack Paternal Grandfather     Past Surgical History:  Procedure Laterality Date   ABDOMINAL HYSTERECTOMY     APPENDECTOMY     ARTHRODESIS METATARSALPHALANGEAL JOINT (MTPJ) Right 12/25/2019   Procedure: Right hallux  metatarsal phalangeal joint arthrodesis;  Surgeon: Toni Arthurs, MD;  Location: [facility];  Service: Orthopedics;  Laterality: Right;   CERVICAL FUSION     CESAREAN SECTION     CHOLECYSTECTOMY     FOOT SURGERY Left    TOTAL COLECTOMY     WEIL OSTEOTOMY Right 12/25/2019   Procedure: 2nd metatarsal Weil Osteotomy;  Surgeon: Toni Arthurs, MD;  Location: [facility];  Service: Orthopedics;  Laterality: Right;   Social History   Occupational History   Not on file  Tobacco Use   Smoking status: Never   Smokeless tobacco: Never  Substance and Sexual Activity   Alcohol use: Not Currently    Comment: occ.    Drug use: Never   Sexual activity: Not on file

## 2023-11-01 ENCOUNTER — Encounter: Payer: Self-pay | Admitting: Orthopedic Surgery

## 2023-11-13 ENCOUNTER — Ambulatory Visit: Admitting: Orthopedic Surgery

## 2023-11-20 ENCOUNTER — Encounter: Payer: Self-pay | Admitting: Orthopedic Surgery

## 2023-11-20 ENCOUNTER — Telehealth: Payer: Self-pay | Admitting: Orthopedic Surgery

## 2023-11-20 NOTE — Progress Notes (Unsigned)
 TELEPHONE CONVERSATION VIA [facility]THOCARE PRACTICE CALL:  Discussed with patient over the phone via practice call given concern for left ankle.  She states that she was simply walking and that she may have felt a crack at the left ankle with notable pain and inability to weight-bear.  No significant injury mechanism described.  She is aware of her history of osteoporosis.  She had a prior right ankle injury being treated a few months prior as well.  We discussed the potential for fracture in this region given the story and the need for clinical and radiographic workup in the emergency department setting.  She would prefer to wait until morning and contact our office to be brought in for workup.  Given the minimal mechanism of this possible injury, I did discuss with her that the likelihood for a true fracture dislocation at the ankle that would require immediate reduction in the emergency department setting is low, however I did explain to her that should she notice significant pain swelling which is worsening that she should present to the emergency department sooner than tomorrow for appropriate clinical and radiographic workup in order to better determine severity.  Explained that without an x-ray, it is difficult to know if there is a true injury in this region that needs urgent attention.  She expressed full understanding, will do elevation and wrapping of the ankle currently and remain nonweightbearing.  Understanding the precautions above, she will present to the emergency department should she notice increasing pain and swelling in this region for more urgent attention, otherwise will contact our office in the morning.  [facility], Hand Surgery

## 2023-11-21 ENCOUNTER — Other Ambulatory Visit: Payer: Self-pay | Admitting: Physician Assistant

## 2023-11-21 ENCOUNTER — Other Ambulatory Visit (INDEPENDENT_AMBULATORY_CARE_PROVIDER_SITE_OTHER): Payer: Self-pay

## 2023-11-21 ENCOUNTER — Ambulatory Visit: Admitting: Physician Assistant

## 2023-11-21 ENCOUNTER — Telehealth: Payer: Self-pay

## 2023-11-21 ENCOUNTER — Telehealth: Payer: Self-pay | Admitting: Pharmacy Technician

## 2023-11-21 ENCOUNTER — Encounter: Payer: Self-pay | Admitting: Physician Assistant

## 2023-11-21 DIAGNOSIS — M25572 Pain in left ankle and joints of left foot: Secondary | ICD-10-CM | POA: Diagnosis not present

## 2023-11-21 DIAGNOSIS — M25571 Pain in right ankle and joints of right foot: Secondary | ICD-10-CM | POA: Diagnosis not present

## 2023-11-21 MED ORDER — HYDROCODONE-ACETAMINOPHEN 5-325 MG PO TABS: 1.0000 | ORAL_TABLET | Freq: Four times a day (QID) | ORAL | 0 refills | Status: AC | PRN

## 2023-11-21 NOTE — Progress Notes (Signed)
 Office Visit Note   Patient: Jody Burns           Date of Birth: 1960-12-04           MRN: [id number] Visit Date: 11/21/2023              Requested by: No referring provider defined for this encounter. PCP: Alyson Ingles, PA-C (Inactive)   Assessment & Plan: Visit Diagnoses:  1. Pain in left ankle and joints of left foot   2. Pain in right ankle and joints of right foot     Plan: Pleasant 63 year old woman with a history of right avulsion fracture to the distal fibula Weber A.  Has been followed by Dr. Lajoyce Corners.  Comes in today with lateral left ankle pain.  She said she was going down some stairs and heard a loud pop as did her husband.  Following that she had acute pain on the lateral side of her ankle.  She does have a history of osteoporosis and being followed elsewhere and is due to have Reclast.  X-rays today did not show any acute fracture she is more tender over the lateral ligaments would like her to stay in the boot weightbearing as tolerated for 2 weeks we will reexamine her at that time  Follow-Up Instructions: 2 weeks  Orders:  Orders Placed This Encounter  Procedures   XR Ankle Complete Right   XR Ankle Complete Left   No orders of the defined types were placed in this encounter.     Procedures: No procedures performed   Clinical Data: No additional findings.   Subjective: No chief complaint on file.   HPI Jody Burns is a pleasant 63 year old woman who comes in today with a chief complaint of left lateral ankle pain.  She has a history of a Weber a fracture of the right distal fibula.  She said with regards to this injury she simply was going on some stairs and felt a very loud pop.  After that she had difficulty bearing weight  Review of Systems  All other systems reviewed and are negative.    Objective: Vital Signs: There were no vitals taken for this visit.  Physical Exam Constitutional:      Appearance: Normal appearance.  Pulmonary:      Effort: Pulmonary effort is normal.  Skin:    General: Skin is warm and dry.  Neurological:     General: No focal deficit present.     Mental Status: She is alert and oriented to person, place, and time.     Ortho Exam Examination of her left ankle she has no ecchymosis minimal swelling compartments are soft and compressible she is neurovascularly intact she has a little tenderness over the distal fibula but more over the lateral ligaments.  She does have pain with plantarflexion reproduced in the root ligaments Specialty Comments:  No specialty comments available.  Imaging: XR Ankle Complete Right Result Date: 11/21/2023 Radiographs of her right ankle were compared to previous avulsion fracture of the lateral malleolus appears healed  XR Ankle Complete Left Result Date: 11/21/2023 Radiographs of her left ankle no obvious deformity well-maintained alignment    PMFS History: Patient Active Problem List   Diagnosis Date Noted   Pain in left ankle and joints of left foot 11/21/2023   Hyperlipidemia 04/18/2022   Family history of premature CAD 04/18/2022   Menopause, premature 11/16/2020   Osteoporosis without current pathological fracture 11/16/2020   Lynch syndrome 11/16/2020  Past Medical History:  Diagnosis Date   Abnormal results of function studies of other organs and systems    Actinic keratosis    Anemia    Asthma    Elevated liver enzymes    Gastritis    GERD (gastroesophageal reflux disease)    H/O colectomy    H/O: hysterectomy    Hyperlipidemia    Interstitial cystitis    LUQ abdominal pain    Lynch syndrome    Over weight    Perforated sigmoid colon (HCC)    Schatzki's ring    Vitamin D deficiency     Family History  Problem Relation Age of Onset   Lung cancer Mother    Heart attack Father    Peripheral Artery Disease Father    Heart attack Brother    Congestive Heart Failure Maternal Grandmother    Heart attack Paternal Grandfather     Past  Surgical History:  Procedure Laterality Date   ABDOMINAL HYSTERECTOMY     APPENDECTOMY     ARTHRODESIS METATARSALPHALANGEAL JOINT (MTPJ) Right 12/25/2019   Procedure: Right hallux metatarsal phalangeal joint arthrodesis;  Surgeon: Toni Arthurs, MD;  Location: [facility];  Service: Orthopedics;  Laterality: Right;   CERVICAL FUSION     CESAREAN SECTION     CHOLECYSTECTOMY     FOOT SURGERY Left    TOTAL COLECTOMY     WEIL OSTEOTOMY Right 12/25/2019   Procedure: 2nd metatarsal Weil Osteotomy;  Surgeon: Toni Arthurs, MD;  Location: [facility];  Service: Orthopedics;  Laterality: Right;   Social History   Occupational History   Not on file  Tobacco Use   Smoking status: Never   Smokeless tobacco: Never  Substance and Sexual Activity   Alcohol use: Not Currently    Comment: occ.    Drug use: Never   Sexual activity: Not on file

## 2023-11-21 NOTE — Telephone Encounter (Signed)
 I called pt and lm on vm to advise that I received a message from the front desk about her left ankle injury potential fracture. Dr. Lajoyce Corners is in surgery all day today but offered an appt tomorrow. I asked tht she call back and ask for me specifically and I would be happy to make an appt for eval. Will hold this message pending return call.

## 2023-11-21 NOTE — Telephone Encounter (Signed)
 Pharmacy Patient Advocate Encounter   Received notification from CoverMyMeds that prior authorization for nexlizet is required/requested.   Insurance verification completed.   The patient is insured through [facility] .   Per test claim: PA required; PA submitted to above mentioned insurance via CoverMyMeds Key/confirmation #/EOC [id number] Status is pending

## 2023-11-21 NOTE — Telephone Encounter (Signed)
 Pt is coming in to see [facility] today per the front desk and if anything is needed she will speak with Dr. Lajoyce Corners.

## 2023-11-22 NOTE — Telephone Encounter (Signed)
 Pharmacy Patient Advocate Encounter  Received notification from [facility] that Prior Authorization for nexlizet has been APPROVED from 11/21/23 to 11/20/24   PA #/Case ID/Reference #: [id number]

## 2023-12-11 ENCOUNTER — Ambulatory Visit: Admitting: Physician Assistant

## 2023-12-14 ENCOUNTER — Other Ambulatory Visit: Payer: BC Managed Care – PPO

## 2024-01-09 ENCOUNTER — Ambulatory Visit (INDEPENDENT_AMBULATORY_CARE_PROVIDER_SITE_OTHER): Admitting: Physician Assistant

## 2024-01-09 ENCOUNTER — Encounter: Payer: Self-pay | Admitting: Physician Assistant

## 2024-01-09 ENCOUNTER — Other Ambulatory Visit (INDEPENDENT_AMBULATORY_CARE_PROVIDER_SITE_OTHER): Payer: Self-pay

## 2024-01-09 ENCOUNTER — Other Ambulatory Visit: Payer: Self-pay

## 2024-01-09 DIAGNOSIS — M25572 Pain in left ankle and joints of left foot: Secondary | ICD-10-CM | POA: Diagnosis not present

## 2024-01-09 DIAGNOSIS — M25571 Pain in right ankle and joints of right foot: Secondary | ICD-10-CM | POA: Diagnosis not present

## 2024-01-09 NOTE — Progress Notes (Signed)
 Office Visit Note   Patient: Jody Burns           Date of Birth: Dec 25, 1960           MRN: [id number] Visit Date: 01/09/2024              Requested by: No referring provider defined for this encounter. PCP: Tandy Fam, PA-C (Inactive)  Right ankle pain    HPI: Patient is a pleasant 63 year old woman who I was seeing for left ankle pain.  She comes in today not complaining of her left ankle but states that 10 days ago had a cabinet hit against the outside of her right ankle.  She is status post a transverse fracture of the lateral malleolus there and back in December she is concerned that she reinjured it.  Assessment & Plan: Visit Diagnoses: Right ankle contusion  Plan: X-rays do not demonstrate any acute fractures.  I told her to treat this symptomatically she could wear her boot ice elevate.  Should get better in the next few weeks should follow-up if she does not improve  Follow-Up Instructions: No follow-ups on file.   Ortho Exam  Patient is alert, oriented, no adenopathy, well-dressed, normal affect, normal respiratory effort. Right ankleno ecchymosis mild soft tissue swelling she has good dorsiflexion plantarflexion eversion inversion.  She is focally tender over the distal lateral malleolus but no evidence of any crepitus with palpation  Imaging: No results found. No images are attached to the encounter.  Labs: Lab Results  Component Value Date   LABURIC 4.4 08/23/2023     Lab Results  Component Value Date   ALBUMIN 4.3 08/23/2023   ALBUMIN 4.2 04/19/2023    No results found for: "MG" No results found for: "VD25OH"  No results found for: "PREALBUMIN"     No data to display           There is no height or weight on file to calculate BMI.  Orders:  No orders of the defined types were placed in this encounter.  No orders of the defined types were placed in this encounter.    Procedures: No procedures performed  Clinical  Data: No additional findings.  ROS:  All other systems negative, except as noted in the HPI. Review of Systems  Objective: Vital Signs: There were no vitals taken for this visit.  Specialty Comments:  No specialty comments available.  PMFS History: Patient Active Problem List   Diagnosis Date Noted   Pain in left ankle and joints of left foot 11/21/2023   Hyperlipidemia 04/18/2022   Family history of premature CAD 04/18/2022   Menopause, premature 11/16/2020   Osteoporosis without current pathological fracture 11/16/2020   Lynch syndrome 11/16/2020   Past Medical History:  Diagnosis Date   Abnormal results of function studies of other organs and systems    Actinic keratosis    Anemia    Asthma    Elevated liver enzymes    Gastritis    GERD (gastroesophageal reflux disease)    H/O colectomy    H/O: hysterectomy    Hyperlipidemia    Interstitial cystitis    LUQ abdominal pain    Lynch syndrome    Over weight    Perforated sigmoid colon (HCC)    Schatzki's ring    Vitamin D deficiency     Family History  Problem Relation Age of Onset   Lung cancer Mother    Heart attack Father    Peripheral Artery  Disease Father    Heart attack Brother    Congestive Heart Failure Maternal Grandmother    Heart attack Paternal Grandfather     Past Surgical History:  Procedure Laterality Date   ABDOMINAL HYSTERECTOMY     APPENDECTOMY     ARTHRODESIS METATARSALPHALANGEAL JOINT (MTPJ) Right 12/25/2019   Procedure: Right hallux metatarsal phalangeal joint arthrodesis;  Surgeon: Amada Backer, MD;  Location: [facility];  Service: Orthopedics;  Laterality: Right;   CERVICAL FUSION     CESAREAN SECTION     CHOLECYSTECTOMY     FOOT SURGERY Left    TOTAL COLECTOMY     WEIL OSTEOTOMY Right 12/25/2019   Procedure: 2nd metatarsal Weil Osteotomy;  Surgeon: Amada Backer, MD;  [facility];  Service: Orthopedics;  Laterality: Right;   Social History    Occupational History   Not on file  Tobacco Use   Smoking status: Never   Smokeless tobacco: Never  Substance and Sexual Activity   Alcohol use: Not Currently    Comment: occ.    Drug use: Never   Sexual activity: Not on file

## 2024-06-16 ENCOUNTER — Encounter: Payer: Self-pay | Admitting: Radiology
